# Patient Record
Sex: Male | Born: 1970 | Race: White | Hispanic: No | Marital: Married | Smoking: Never smoker
Health system: Southern US, Community
[De-identification: ages and names within clinical notes are randomized; demographics above are authoritative.]

## PROBLEM LIST (undated history)

## (undated) DIAGNOSIS — E781 Pure hyperglyceridemia: Secondary | ICD-10-CM

## (undated) DIAGNOSIS — Z8249 Family history of ischemic heart disease and other diseases of the circulatory system: Secondary | ICD-10-CM

## (undated) DIAGNOSIS — J40 Bronchitis, not specified as acute or chronic: Secondary | ICD-10-CM

## (undated) DIAGNOSIS — H6690 Otitis media, unspecified, unspecified ear: Secondary | ICD-10-CM

## (undated) DIAGNOSIS — J349 Unspecified disorder of nose and nasal sinuses: Secondary | ICD-10-CM

## (undated) DIAGNOSIS — R040 Epistaxis: Secondary | ICD-10-CM

## (undated) DIAGNOSIS — R490 Dysphonia: Secondary | ICD-10-CM

## (undated) DIAGNOSIS — R079 Chest pain, unspecified: Secondary | ICD-10-CM

## (undated) DIAGNOSIS — L509 Urticaria, unspecified: Secondary | ICD-10-CM

## (undated) DIAGNOSIS — M1990 Unspecified osteoarthritis, unspecified site: Secondary | ICD-10-CM

## (undated) DIAGNOSIS — Z460 Encounter for fitting and adjustment of spectacles and contact lenses: Secondary | ICD-10-CM

## (undated) HISTORY — DX: Epistaxis: R04.0

## (undated) HISTORY — PX: APPENDECTOMY: SHX54

## (undated) HISTORY — DX: Otitis media, unspecified, unspecified ear: H66.90

## (undated) HISTORY — DX: Bronchitis, not specified as acute or chronic: J40

## (undated) HISTORY — DX: Chest pain, unspecified: R07.9

## (undated) HISTORY — DX: Dysphonia: R49.0

## (undated) HISTORY — DX: Encounter for fitting and adjustment of spectacles and contact lenses: Z46.0

## (undated) HISTORY — DX: Urticaria, unspecified: L50.9

## (undated) HISTORY — DX: Unspecified osteoarthritis, unspecified site: M19.90

## (undated) HISTORY — DX: Pure hyperglyceridemia: E78.1

## (undated) HISTORY — DX: Family history of ischemic heart disease and other diseases of the circulatory system: Z82.49

## (undated) HISTORY — DX: Unspecified disorder of nose and nasal sinuses: J34.9

---

## 1999-12-30 ENCOUNTER — Emergency Department (HOSPITAL_COMMUNITY): Admission: EM | Admit: 1999-12-30 | Discharge: 1999-12-30 | Payer: Self-pay | Admitting: Emergency Medicine

## 2004-01-09 ENCOUNTER — Encounter: Admission: RE | Admit: 2004-01-09 | Discharge: 2004-04-08 | Payer: Self-pay | Admitting: *Deleted

## 2005-08-15 ENCOUNTER — Ambulatory Visit (HOSPITAL_COMMUNITY): Admission: RE | Admit: 2005-08-15 | Discharge: 2005-08-15 | Payer: Self-pay | Admitting: Orthopedic Surgery

## 2008-11-30 ENCOUNTER — Encounter (INDEPENDENT_AMBULATORY_CARE_PROVIDER_SITE_OTHER): Payer: Self-pay | Admitting: *Deleted

## 2009-08-25 ENCOUNTER — Observation Stay (HOSPITAL_COMMUNITY): Admission: EM | Admit: 2009-08-25 | Discharge: 2009-08-26 | Payer: Self-pay | Admitting: Emergency Medicine

## 2011-01-15 LAB — COMPREHENSIVE METABOLIC PANEL
ALT: 45 U/L (ref 0–53)
AST: 25 U/L (ref 0–37)
Albumin: 4.4 g/dL (ref 3.5–5.2)
Alkaline Phosphatase: 90 U/L (ref 39–117)
BUN: 14 mg/dL (ref 6–23)
CO2: 28 mEq/L (ref 19–32)
Calcium: 10.2 mg/dL (ref 8.4–10.5)
Chloride: 102 mEq/L (ref 96–112)
Creatinine, Ser: 1.02 mg/dL (ref 0.4–1.5)
GFR calc Af Amer: >60 mL/min (ref >60)
GFR calc non Af Amer: >60 mL/min (ref >60)
Glucose, Bld: 124 mg/dL — ABNORMAL HIGH (ref 70–99)
Potassium: 3.7 mEq/L (ref 3.5–5.1)
Sodium: 137 mEq/L (ref 135–145)
Total Bilirubin: 1 mg/dL (ref 0.3–1.2)
Total Protein: 7.4 g/dL (ref 6.0–8.3)

## 2011-01-15 LAB — CBC
HCT: 44.2 % (ref 39.0–52.0)
Hemoglobin: 15.6 g/dL (ref 13.0–17.0)
MCHC: 35.4 g/dL (ref 30.0–36.0)
MCV: 89.4 fL (ref 78.0–100.0)
Platelets: 236 10*3/uL (ref 150–400)
RBC: 4.94 MIL/uL (ref 4.22–5.81)
RDW: 12.4 % (ref 11.5–15.5)
WBC: 11.3 10*3/uL — ABNORMAL HIGH (ref 4.0–10.5)

## 2011-01-15 LAB — DIFFERENTIAL
Basophils Absolute: 0 10*3/uL (ref 0.0–0.1)
Basophils Relative: 0 % (ref 0–1)
Eosinophils Absolute: 0.1 10*3/uL (ref 0.0–0.7)
Eosinophils Relative: 1 % (ref 0–5)
Lymphocytes Relative: 18 % (ref 12–46)
Lymphs Abs: 2 10*3/uL (ref 0.7–4.0)
Monocytes Absolute: 0.6 10*3/uL (ref 0.1–1.0)
Monocytes Relative: 5 % (ref 3–12)
Neutro Abs: 8.5 10*3/uL — ABNORMAL HIGH (ref 1.7–7.7)
Neutrophils Relative %: 76 % (ref 43–77)

## 2011-01-15 LAB — URINALYSIS, ROUTINE W REFLEX MICROSCOPIC
Bilirubin Urine: NEGATIVE
Glucose, UA: NEGATIVE mg/dL
Hgb urine dipstick: NEGATIVE
Ketones, ur: NEGATIVE mg/dL
Nitrite: NEGATIVE
Protein, ur: NEGATIVE mg/dL
Specific Gravity, Urine: 1.022 (ref 1.005–1.030)
Urobilinogen, UA: 0.2 mg/dL (ref 0.0–1.0)
pH: 7.5 (ref 5.0–8.0)

## 2012-03-16 ENCOUNTER — Encounter (INDEPENDENT_AMBULATORY_CARE_PROVIDER_SITE_OTHER): Payer: Self-pay | Admitting: General Surgery

## 2013-02-16 ENCOUNTER — Ambulatory Visit (HOSPITAL_COMMUNITY)
Admission: RE | Admit: 2013-02-16 | Discharge: 2013-02-16 | Disposition: A | Discharge status: Home / Self Care | Payer: BC Managed Care – PPO | Source: Ambulatory Visit | Attending: Sports Medicine | Admitting: Sports Medicine

## 2013-02-16 ENCOUNTER — Other Ambulatory Visit (HOSPITAL_COMMUNITY): Payer: Self-pay | Admitting: Sports Medicine

## 2013-02-16 DIAGNOSIS — Z01818 Encounter for other preprocedural examination: Secondary | ICD-10-CM | POA: Insufficient documentation

## 2013-02-16 DIAGNOSIS — Z139 Encounter for screening, unspecified: Secondary | ICD-10-CM

## 2013-05-04 ENCOUNTER — Other Ambulatory Visit: Payer: Self-pay | Admitting: Dermatology

## 2013-08-09 ENCOUNTER — Encounter: Payer: Self-pay | Admitting: Cardiology

## 2013-08-11 ENCOUNTER — Encounter: Payer: Self-pay | Admitting: Cardiology

## 2013-08-11 ENCOUNTER — Ambulatory Visit (INDEPENDENT_AMBULATORY_CARE_PROVIDER_SITE_OTHER): Payer: BC Managed Care – PPO | Admitting: Cardiology

## 2013-08-11 VITALS — BP 118/76 | HR 68 | Ht 69.5 in | Wt 185.0 lb

## 2013-08-11 DIAGNOSIS — Z8249 Family history of ischemic heart disease and other diseases of the circulatory system: Secondary | ICD-10-CM | POA: Insufficient documentation

## 2013-08-11 DIAGNOSIS — E781 Pure hyperglyceridemia: Secondary | ICD-10-CM

## 2013-08-11 DIAGNOSIS — R079 Chest pain, unspecified: Secondary | ICD-10-CM

## 2013-08-11 HISTORY — DX: Family history of ischemic heart disease and other diseases of the circulatory system: Z82.49

## 2013-08-11 HISTORY — DX: Pure hyperglyceridemia: E78.1

## 2013-08-11 HISTORY — DX: Chest pain, unspecified: R07.9

## 2013-08-11 NOTE — Progress Notes (Signed)
[address], [address] [place], [place]  [address] Phone: [phone] Fax:  [phone]  Date:  08/11/2013   ID:  Ricky Bautista, DOB 1971-09-29, MRN [id number]  PCP:  Gweneth Dimitri, MD   History of Present Illness: Ricky Bautista is a 42 y.o. male here for the evaluation of chest pain. Has a strong family history of early CAD, father, paternal siblings with MIs/bypasses in their 24s, has a history himself of hypertriglyceridemia with low HDL, high small LDL particle number. He's had 2 separate episodes of awakening in the middle of the night with substernal/midepigastric pain described as a tightness radiating around his chest wall. Back to front, bandlike. Intense pain. Ear throbbed, sweaty.  He does not think he was necessarily heartburn. Most recent episode occurred in late September of 2014. He exercises and is physically active on his job as well as with his son's [employer] trip. He is not complaining of any shortness of breath or chest pain with exercise. EKG was performed and personally viewed on 07/25/13 which showed sinus rhythm with no other abnormalities. Hemoglobin A1c 5.5, glucose 121 slightly elevated, creatinine 1.05, total cholesterol 203, triglycerides 342, LDL 101 this has increased. Recommended considering a trial of Crestor since he has not tolerated Niaspan (flusing) or Lopid treatment (liver) long-term.  Currently doing well exercise-induced chest pain.  Wt Readings from Last 3 Encounters:  08/11/13 185 lb (83.915 kg)     Past Medical History  Diagnosis Date  . Bronchitis   . Ear infection   . Sinus problem   . Bleeding nose   . Hoarseness   . Contact lens/glasses fitting   . Arthritis     Past Surgical History  Procedure Laterality Date  . Appendectomy      No current outpatient prescriptions on file.   No current facility-administered medications for this visit.    Allergies:    Allergies  Allergen Reactions  . Erythromycin   .  Toradol [Ketorolac Tromethamine]     Social History:  The patient  reports that he has never smoked. He has never used smokeless tobacco. He reports that he does not drink alcohol or use illicit drugs.   Family History  Problem Relation Age of Onset  . Heart attack Mother     ROS:  Please see the history of present illness.   Denies any fevers, chills, orthopnea, PND, syncope, bleeding, fevers, dysphagia.   All other systems reviewed and negative.   PHYSICAL EXAM: VS:  BP 118/76  Pulse 68  Ht 5' 9.5" (1.765 m)  Wt 185 lb (83.915 kg)  BMI 26.94 kg/m2 Well nourished, well developed, in no acute distress HEENT: normal, [place]/AT, EOMI Neck: no JVD, normal carotid upstroke, no bruit Cardiac:  normal S1, S2; RRR; no murmur Lungs:  clear to auscultation bilaterally, no wheezing, rhonchi or rales Abd: soft, nontender, no hepatomegaly, no bruits Ext: no edema, 2+ distal pulses Skin: warm and dry GU: deferred Neuro: no focal abnormalities noted, AAO x 3  EKG:  No ST segment changes, normal. See lab work above, prior medical records reviewed.  ASSESSMENT AND PLAN:  1. Chest pain-strong family history, hyperlipidemia/hypertriglyceridemia. Recommendation is for exercise treadmill test. I also recommend taking Crestor as was previously recommended by Dr. Corliss Blacker. I think given his strong family history, history of hypertriglyceridemia this would be of benefit for him from a primary prevention standpoint. He seemed hesitant about this medication, concerned about potential liver issues or  long-term issues. I do feel that the benefit outweighs the risk. Calcium scoring, $150 out-of-pocket expense, could be considered as well if it would help him or push him in the direction of statin therapy if he feels as though there is evidence of coronary atherosclerosis taking place. Continue with fitness, exercise. He knows to contact me if any worrisome symptoms develop. Also consider low-dose  aspirin. 2. Familial hypertriglyceridemia-triglycerides fluctuated from 1200, 350 currently. 350 currently is without medication. Did encourage him to take at least fish oil which can help reduce triglycerides up to 50%. He had liver issues in the past with Lopid he states. Flushing with Niaspan although he seemed to tolerate this. Overall, I think that Crestor would be a great addition for him. I asked him to strongly consider this. We discussed the risk of pancreatitis with triglycerides greater than 500. 3. Family history of CAD-early. Father. 4. We will followup with treadmill test.  Signed, Donato Schultz, MD [facility]  08/11/2013 11:24 AM

## 2013-08-11 NOTE — Patient Instructions (Signed)
Your physician has requested that you have an exercise tolerance test. For further information please visit [email or web address] Please also follow instruction sheet.  Your physician recommends that you continue on your current medications as directed. Please refer to the Current Medication list given to you today.  Your physician recommends that you follow-up as needed

## 2013-09-13 ENCOUNTER — Encounter: Payer: Self-pay | Admitting: Nurse Practitioner

## 2013-09-13 ENCOUNTER — Ambulatory Visit (INDEPENDENT_AMBULATORY_CARE_PROVIDER_SITE_OTHER): Payer: BC Managed Care – PPO | Admitting: Nurse Practitioner

## 2013-09-13 ENCOUNTER — Encounter (INDEPENDENT_AMBULATORY_CARE_PROVIDER_SITE_OTHER): Payer: Self-pay

## 2013-09-13 ENCOUNTER — Other Ambulatory Visit: Payer: Self-pay | Admitting: *Deleted

## 2013-09-13 VITALS — BP 133/89 | HR 88

## 2013-09-13 DIAGNOSIS — R079 Chest pain, unspecified: Secondary | ICD-10-CM

## 2013-09-13 DIAGNOSIS — E785 Hyperlipidemia, unspecified: Secondary | ICD-10-CM

## 2013-09-13 DIAGNOSIS — R9439 Abnormal result of other cardiovascular function study: Secondary | ICD-10-CM

## 2013-09-13 MED ORDER — ASPIRIN EC 81 MG PO TBEC: 81.0000 mg | DELAYED_RELEASE_TABLET | Freq: Every day | ORAL | Status: DC

## 2013-09-13 MED ORDER — ROSUVASTATIN CALCIUM 10 MG PO TABS: 10.0000 mg | ORAL_TABLET | Freq: Every day | ORAL | Status: DC

## 2013-09-13 NOTE — Progress Notes (Signed)
Exercise Treadmill Test  Pre-Exercise Testing Evaluation Rhythm: normal sinus  Rate: 78 BPM     Test  Exercise Tolerance Test Ordering MD: Donato Schultz, MD  Interpreting MD: Norma Fredrickson, NP  Unique Test No: 1  Treadmill:  1  Indication for ETT: chest pain - rule out ischemia/family hx  Contraindication to ETT: Yes   Stress Modality: exercise - treadmill  Cardiac Imaging Performed: non   Protocol: standard Bruce - maximal  Max BP:  181/86  Max MPHR (bpm):  178 85% MPR (bpm):  151  MPHR obtained (bpm):  166 % MPHR obtained:  93%  Reached 85% MPHR (min:sec):  9:50 Total Exercise Time (min-sec):  12 minutes  Workload in METS:  13.4 Borg Scale: 13  Reason ETT Terminated:  desired heart rate attained    ST Segment Analysis At Rest: normal ST segments - no evidence of significant ST depression With Exercise: significant ischemic ST depression  Other Information Arrhythmia:  No Angina during ETT:  absent (0) Quality of ETT:  diagnostic  ETT Interpretation:  abnormal - evidence of ST depression consistent with ischemia  Comments: Patient presents today for routine GXT. Has positive FH for CAD and dyslipidemia. No active symptoms at present but has had 2 past episodes of chest discomfort at night. He exercises regularly. Not on treatment for his HLD.  Today the patient exercised on the standard Bruce protocol for a total of 12 minutes.  Excellent exercise tolerance.  Adequate blood pressure response.  Clinically negative for chest pain. Test was stopped due to achievement of target HR and EKG changes.  EKG positive for ischemia at the peak of exercise. No significant arrhythmia noted.   Recommendations: His GXT is abnormal. His tracings have been reviewed with Dr. Anne Fu. Patient was without symptoms during this study. Cardiac calcium not felt to be needed at this time. Have discussed with Dr. Anne Fu - will proceed with Myoview testing. Low threshold to proceed with cardiac  catheterization.  Start baby aspirin daily  Start Crestor 10 mg daily - recheck labs in 4 weeks.  Further disposition to follow.   Patient is agreeable to this plan and will call if any problems develop in the interim.   Rosalio Macadamia, RN, ANP-C [facility] [address] [place], [place]  [address]

## 2013-09-13 NOTE — Patient Instructions (Signed)
Start baby aspirin daily  Start Crestor 10 mg daily  We will arrange for a stress Myoview  Recheck labs in 4 weeks   Call the [facility] office at [phone] if you have any questions, problems or concerns.

## 2013-09-15 ENCOUNTER — Ambulatory Visit (HOSPITAL_COMMUNITY): Payer: BC Managed Care – PPO | Attending: Cardiology | Admitting: Radiology

## 2013-09-15 VITALS — BP 116/91 | HR 64 | Ht 69.5 in | Wt 184.0 lb

## 2013-09-15 DIAGNOSIS — R9439 Abnormal result of other cardiovascular function study: Secondary | ICD-10-CM

## 2013-09-15 DIAGNOSIS — Z8249 Family history of ischemic heart disease and other diseases of the circulatory system: Secondary | ICD-10-CM | POA: Insufficient documentation

## 2013-09-15 DIAGNOSIS — R079 Chest pain, unspecified: Secondary | ICD-10-CM | POA: Insufficient documentation

## 2013-09-15 DIAGNOSIS — E785 Hyperlipidemia, unspecified: Secondary | ICD-10-CM | POA: Insufficient documentation

## 2013-09-15 MED ORDER — TECHNETIUM TC 99M SESTAMIBI GENERIC - CARDIOLITE
11.0000 | Freq: Once | INTRAVENOUS | Status: AC | PRN
  Administered 2013-09-15: 11 via INTRAVENOUS

## 2013-09-15 MED ORDER — TECHNETIUM TC 99M SESTAMIBI GENERIC - CARDIOLITE
33.0000 | Freq: Once | INTRAVENOUS | Status: AC | PRN
  Administered 2013-09-15: 33 via INTRAVENOUS

## 2013-09-15 NOTE — Progress Notes (Signed)
[facility] [address] NUCLEAR MED [address] [place], [place] [address] [phone]    Cardiology Nuclear Med Study  Ricky Bautista is a 42 y.o. male     MRN : [id number]     DOB: [birth year]  Procedure Date: 09/15/2013  Nuclear Med Background Indication for Stress Test:  Evaluation for Ischemia and Abnormal GXT History:  No known CAD Cardiac Risk Factors: Family History - CAD and Lipids  Symptoms:  Chest Pain   Nuclear Pre-Procedure Caffeine/Decaff Intake:  None NPO After: 9:00pm   Lungs:  clear O2 Sat: 99% on room air. IV 0.9% NS with Angio Cath:  22g  IV Site: R Hand  IV Started by:  Bonnita Levan, RN  Chest Size (in):  42 Cup Size: n/a  Height: 5' 9.5" (1.765 m)  Weight:  184 lb (83.462 kg)  BMI:  Body mass index is 26.79 kg/(m^2). Tech Comments:  N/A    Nuclear Med Study 1 or 2 day study: 1 day  Stress Test Type:  Stress  Reading MD: Donato Schultz, MD  Order Authorizing Provider:  Donato Schultz, MD  Resting Radionuclide: Technetium 64m Sestamibi  Resting Radionuclide Dose: 11.0 mCi   Stress Radionuclide:  Technetium 43m Sestamibi  Stress Radionuclide Dose: 33.0 mCi           Stress Protocol Rest HR: 64 Stress HR: 166  Rest BP: 116/91 Stress BP: 152/82  Exercise Time (min): 11:00 METS: 13.3           Dose of Adenosine (mg):  n/a Dose of Lexiscan: n/a mg  Dose of Atropine (mg): n/a Dose of Dobutamine: n/a mcg/kg/min (at max HR)  Stress Test Technologist: Nelson Chimes, BS-ES  Nuclear Technologist:  Domenic Polite, CNMT     Rest Procedure:  Myocardial perfusion imaging was performed at rest 45 minutes following the intravenous administration of Technetium 72m Sestamibi. Rest ECG: NSR - Normal EKG  Stress Procedure:  The patient exercised on the treadmill utilizing the Bruce Protocol for 11:00 minutes. The patient stopped due to fatigue and denied any chest pain.  Technetium 55m Sestamibi was injected at peak exercise and myocardial perfusion imaging was  performed after a brief delay. Stress ECG: No significant change from baseline ECG  QPS Raw Data Images:  Mild diaphragmatic attenuation.  Normal left ventricular size. Stress Images:  There is decreased uptake in the inferior wall. Rest Images:  There is decreased uptake in the inferior wall. Subtraction (SDS):  No evidence of ischemia. Transient Ischemic Dilatation (Normal <1.22):  1.09 Lung/Heart Ratio (Normal <0.45):  0.42  Quantitative Gated Spect Images QGS EDV:  96 ml QGS ESV:  40 ml  Impression Exercise Capacity:  Excellent exercise capacity. BP Response:  Normal blood pressure response. Clinical Symptoms:  No significant symptoms noted. ECG Impression:  No significant ST segment change suggestive of ischemia. Comparison with Prior Nuclear Study: No previous nuclear study performed  Overall Impression:  Low risk stress nuclear study with no ischemia. Inferior wall defect likely diaphragmatic attenuation. If symptoms occur, further testing may be warranted. .  LV Ejection Fraction: 59%.  LV Wall Motion:  NL LV Function; NL Wall Motion

## 2013-09-19 ENCOUNTER — Telehealth: Payer: Self-pay | Admitting: Cardiology

## 2013-09-19 NOTE — Telephone Encounter (Signed)
New message     Pt want nuclear test results.--OK to tell wife because he will be in a mtg all day.

## 2013-09-22 NOTE — Telephone Encounter (Signed)
Spoke with patient previously and advised of results

## 2013-10-04 ENCOUNTER — Other Ambulatory Visit: Payer: BC Managed Care – PPO

## 2013-12-01 ENCOUNTER — Ambulatory Visit
Admission: RE | Admit: 2013-12-01 | Discharge: 2013-12-01 | Disposition: A | Discharge status: Home / Self Care | Payer: BC Managed Care – PPO | Source: Ambulatory Visit | Attending: Family Medicine | Admitting: Family Medicine

## 2013-12-01 ENCOUNTER — Other Ambulatory Visit: Payer: Self-pay | Admitting: Family Medicine

## 2013-12-01 DIAGNOSIS — W19XXXA Unspecified fall, initial encounter: Secondary | ICD-10-CM

## 2013-12-19 ENCOUNTER — Other Ambulatory Visit: Payer: Self-pay | Admitting: Dermatology

## 2014-01-27 ENCOUNTER — Encounter: Payer: Self-pay | Admitting: Family Medicine

## 2014-05-09 ENCOUNTER — Telehealth: Payer: Self-pay | Admitting: Cardiology

## 2014-05-09 DIAGNOSIS — Z8249 Family history of ischemic heart disease and other diseases of the circulatory system: Secondary | ICD-10-CM

## 2014-05-09 DIAGNOSIS — R079 Chest pain, unspecified: Secondary | ICD-10-CM

## 2014-05-09 NOTE — Telephone Encounter (Signed)
Pt calling because he wants to have a Coronary CA score completed.  States he and Dr Anne FuSkains have discussed this in the past but it was never ordered. States he is not having any new s/s just concerned because of his strong family history and a friend that was his age who died with an MI this weekend.  Advised I will discuss with Dr Anne FuSkains to obtain order and once that has been done he will be called to schedule.  He states understanding and is aware the testing is $150 out of pocket.

## 2014-05-09 NOTE — Telephone Encounter (Signed)
New problem    Pt's need to speak to nurse concerning a test that Dr Anne FuSkains had talked to pt about having. Pt's wife didn't remember the name of the scan. Please call pt.

## 2014-05-10 NOTE — Telephone Encounter (Signed)
Please proceed with Coronary calcium score at the office. Thanks. Dx. V17.3 (family history of CAD). Donato SchultzSKAINS, Bartholomew Ramesh, MD

## 2014-05-12 ENCOUNTER — Ambulatory Visit (INDEPENDENT_AMBULATORY_CARE_PROVIDER_SITE_OTHER)
Admission: RE | Admit: 2014-05-12 | Discharge: 2014-05-12 | Disposition: A | Discharge status: Home / Self Care | Payer: Self-pay | Source: Ambulatory Visit | Attending: Cardiology | Admitting: Cardiology

## 2014-05-12 DIAGNOSIS — Z8249 Family history of ischemic heart disease and other diseases of the circulatory system: Secondary | ICD-10-CM

## 2014-05-12 DIAGNOSIS — R079 Chest pain, unspecified: Secondary | ICD-10-CM

## 2014-05-15 ENCOUNTER — Telehealth: Payer: Self-pay | Admitting: Cardiology

## 2014-05-15 NOTE — Telephone Encounter (Signed)
New message     Want ct ca score results

## 2014-05-15 NOTE — Telephone Encounter (Signed)
PT  AWARE OF CA SCORE  RESULTS ./CY 

## 2014-09-26 ENCOUNTER — Telehealth: Payer: Self-pay | Admitting: *Deleted

## 2014-09-26 NOTE — Telephone Encounter (Signed)
-----   Message from Donato SchultzMark Skains, MD sent at 09/26/2014  8:20 AM EST ----- No need for follow-up ----- Message -----    From: Rocco SerenePamela Ezel Vallone, RN    Sent: 09/22/2014  10:31 AM      To: Donato SchultzMark Skains, MD  Does this pt need to f/u with you - 0 Ca score No recall in EPIC  Thanks,  Pam

## 2016-04-12 DIAGNOSIS — L03115 Cellulitis of right lower limb: Secondary | ICD-10-CM | POA: Diagnosis not present

## 2016-08-05 DIAGNOSIS — M7542 Impingement syndrome of left shoulder: Secondary | ICD-10-CM | POA: Diagnosis not present

## 2016-08-05 DIAGNOSIS — M25512 Pain in left shoulder: Secondary | ICD-10-CM | POA: Diagnosis not present

## 2016-10-23 DIAGNOSIS — L570 Actinic keratosis: Secondary | ICD-10-CM | POA: Diagnosis not present

## 2016-10-23 DIAGNOSIS — B078 Other viral warts: Secondary | ICD-10-CM | POA: Diagnosis not present

## 2017-03-24 DIAGNOSIS — Z131 Encounter for screening for diabetes mellitus: Secondary | ICD-10-CM | POA: Diagnosis not present

## 2017-03-24 DIAGNOSIS — E785 Hyperlipidemia, unspecified: Secondary | ICD-10-CM | POA: Diagnosis not present

## 2017-04-13 DIAGNOSIS — M25562 Pain in left knee: Secondary | ICD-10-CM | POA: Diagnosis not present

## 2017-04-13 DIAGNOSIS — M25512 Pain in left shoulder: Secondary | ICD-10-CM | POA: Diagnosis not present

## 2017-04-13 DIAGNOSIS — G8929 Other chronic pain: Secondary | ICD-10-CM | POA: Diagnosis not present

## 2017-04-14 DIAGNOSIS — E785 Hyperlipidemia, unspecified: Secondary | ICD-10-CM | POA: Diagnosis not present

## 2017-04-14 DIAGNOSIS — R7301 Impaired fasting glucose: Secondary | ICD-10-CM | POA: Diagnosis not present

## 2017-04-14 DIAGNOSIS — Z0000 Encounter for general adult medical examination without abnormal findings: Secondary | ICD-10-CM | POA: Diagnosis not present

## 2017-07-18 DIAGNOSIS — S81019A Laceration without foreign body, unspecified knee, initial encounter: Secondary | ICD-10-CM | POA: Diagnosis not present

## 2017-07-18 DIAGNOSIS — S81012A Laceration without foreign body, left knee, initial encounter: Secondary | ICD-10-CM | POA: Diagnosis not present

## 2017-07-18 DIAGNOSIS — W298XXA Contact with other powered powered hand tools and household machinery, initial encounter: Secondary | ICD-10-CM | POA: Diagnosis not present

## 2017-07-18 DIAGNOSIS — W228XXA Striking against or struck by other objects, initial encounter: Secondary | ICD-10-CM | POA: Diagnosis not present

## 2017-09-30 DIAGNOSIS — K1370 Unspecified lesions of oral mucosa: Secondary | ICD-10-CM | POA: Diagnosis not present

## 2017-10-01 DIAGNOSIS — D105 Benign neoplasm of other parts of oropharynx: Secondary | ICD-10-CM | POA: Diagnosis not present

## 2017-10-01 DIAGNOSIS — H93293 Other abnormal auditory perceptions, bilateral: Secondary | ICD-10-CM | POA: Diagnosis not present

## 2017-10-03 DIAGNOSIS — M25512 Pain in left shoulder: Secondary | ICD-10-CM | POA: Diagnosis not present

## 2017-10-03 DIAGNOSIS — G8929 Other chronic pain: Secondary | ICD-10-CM | POA: Diagnosis not present

## 2017-11-20 DIAGNOSIS — L820 Inflamed seborrheic keratosis: Secondary | ICD-10-CM | POA: Diagnosis not present

## 2017-11-20 DIAGNOSIS — D2272 Melanocytic nevi of left lower limb, including hip: Secondary | ICD-10-CM | POA: Diagnosis not present

## 2017-11-20 DIAGNOSIS — B078 Other viral warts: Secondary | ICD-10-CM | POA: Diagnosis not present

## 2017-11-20 DIAGNOSIS — Z85828 Personal history of other malignant neoplasm of skin: Secondary | ICD-10-CM | POA: Diagnosis not present

## 2017-11-20 DIAGNOSIS — L570 Actinic keratosis: Secondary | ICD-10-CM | POA: Diagnosis not present

## 2017-11-20 DIAGNOSIS — L738 Other specified follicular disorders: Secondary | ICD-10-CM | POA: Diagnosis not present

## 2017-11-24 DIAGNOSIS — L0889 Other specified local infections of the skin and subcutaneous tissue: Secondary | ICD-10-CM | POA: Diagnosis not present

## 2017-11-24 DIAGNOSIS — S60522A Blister (nonthermal) of left hand, initial encounter: Secondary | ICD-10-CM | POA: Diagnosis not present

## 2018-03-26 DIAGNOSIS — E785 Hyperlipidemia, unspecified: Secondary | ICD-10-CM | POA: Diagnosis not present

## 2018-03-26 DIAGNOSIS — S30860A Insect bite (nonvenomous) of lower back and pelvis, initial encounter: Secondary | ICD-10-CM | POA: Diagnosis not present

## 2018-03-26 DIAGNOSIS — R7301 Impaired fasting glucose: Secondary | ICD-10-CM | POA: Diagnosis not present

## 2018-04-22 DIAGNOSIS — Z0000 Encounter for general adult medical examination without abnormal findings: Secondary | ICD-10-CM | POA: Diagnosis not present

## 2018-05-20 ENCOUNTER — Ambulatory Visit: Payer: Self-pay | Admitting: Allergy

## 2018-05-28 ENCOUNTER — Ambulatory Visit: Payer: BLUE CROSS/BLUE SHIELD | Admitting: Allergy

## 2018-05-28 ENCOUNTER — Encounter: Payer: Self-pay | Admitting: Allergy

## 2018-05-28 VITALS — BP 122/70 | HR 63 | Temp 97.6°F | Ht 69.2 in | Wt 185.2 lb

## 2018-05-28 DIAGNOSIS — J301 Allergic rhinitis due to pollen: Secondary | ICD-10-CM

## 2018-05-28 DIAGNOSIS — T781XXA Other adverse food reactions, not elsewhere classified, initial encounter: Secondary | ICD-10-CM | POA: Diagnosis not present

## 2018-05-28 DIAGNOSIS — Z91018 Allergy to other foods: Secondary | ICD-10-CM

## 2018-05-28 NOTE — Patient Instructions (Addendum)
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   - will plan to repeat levels in a year to determine trend   - subsequent tick/chigger bites can drive persistence of positive IgE levels   - recommend avoidance of red meat products   - have access to self-injectable epinephrine (Epipen or AuviQ) 0.3mg  at all times   - follow emergency action plan in case of allergic reaction  Allergic rhinitis   - environmental allergy skin testing today is positive to grasses, trees, weeds, dog, cat and molds   - allergen avoidance measures provided   - continue zyrtec 1-2 times a day during spring season   - for itchy/watery/red eyes can use OTC Alaway or Zaditor 1 drop each eye as needed up to twice a day   - for nasal congestion recommend use of nasal steroid    - allergen immunotherapy discussed today including protocol, benefits and risk.  Informational handout provided.  If interested in this therapuetic option you can check with your insurance carrier for coverage.  Let us know if you would like to proceed with this option.    Adverse food reaction     - skin testing to apple is negative    - would recommend avoidance of specific apple and cherry products that has caused symptoms following ingestion    - you have been able to eat other apple and cherries from other stores without issue and with negative testing not concerned you are allergic to the fruit itself.      - recommend washing fresh fruits prior to eating  Follow-up 1 year or sooner if needed

## 2018-05-28 NOTE — Progress Notes (Addendum)
New Patient Note  RE: Ricky Bautista MRN: [id number] DOB: 21-Apr-1971 Date of Office Visit: 05/28/2018  Referring provider: Juluis RainierBarnes, Elizabeth, MD Primary care provider: Gweneth DimitriMcNeill, Wendy, MD  Chief Complaint: positive alpha-gal  History of present illness: Ricky Danceaul L Antos is a 47 y.o. male presenting today for evaluation of a positive alpha-gal lab test. He reports that he went to his PCP to be tested for lyme disease because he found an embedded [place] tick about two months ago. His PCP also tested him for alpha-gal and he was positive at 1.07 ku/L. He reports that he has been avoiding steak for months now because for the past year he gets abdominal pain, bloating, and diarrhea when he eats steak. He is able to eat hamburgers without issue. He eats other red meats including pork and venison without issue. He has an epi-pen. 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 Within the past few weeks he also had a reaction to apples and cherries from [employer] with symptoms including sneezing, congestion, rhinitis, itchy throat, and itchy ears. He reports that he has eaten apples and cherries numerous times in the past without any issue.  He states he was concerned that the fruit may have a gelatin-spray coating as he was talking to a friend of his who stated they have researched that there could be gelatin coatings on fruits.    Allergic rhinitis: he has a history of springtime allergic rhinitis with symptoms including nasal congestion, watery/itchy eyes, and rhinorrhea. He had allergy testing as a child and was on allergy shots for an unknown period of time back then. During the spring he takes zyrtec twice a day. He has not been on an antihistamine recently.  He has no history of asthma or eczema.   Review of systems: Review of Systems  Constitutional: Negative for chills, fever and malaise/fatigue.  HENT: Negative for  congestion, ear discharge, nosebleeds and sore throat.   Eyes: Negative for pain, discharge and redness.  Respiratory: Negative for cough, shortness of breath and wheezing.   Cardiovascular: Negative for chest pain.  Gastrointestinal: Negative for abdominal pain, constipation, diarrhea, heartburn, nausea and vomiting.  Musculoskeletal: Negative for joint pain.  Skin: Negative for itching and rash.  Neurological: Negative for headaches.    All other systems negative unless noted above in HPI  Past medical history: Past Medical History:  Diagnosis Date  . Arthritis   . Bleeding nose   . Bronchitis   . Chest pain 08/11/2013  . Contact lens/glasses fitting   . Ear infection   . 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 Explained risk of pancreatitis  . Family history of ischemic heart disease 08/11/2013  . Hoarseness   . Sinus problem   . Urticaria     Past surgical history: Past Surgical History:  Procedure Laterality Date  . APPENDECTOMY      Family history:  Family History  Problem Relation Age of Onset  . Heart attack Mother   . Allergic rhinitis Father   . Asthma Paternal Grandfather   . Eczema Paternal Grandfather   . Urticaria Paternal Grandfather     Social history: He lives in a home with carpeting with gas and electric heating and central cooling.  There are dogs and chickens outside the home.  There is no concern for water damage, mildew or roaches in the home.  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  He denies a smoking history.  Medication List: Allergies as of 05/28/2018      Reactions   Erythromycin    Toradol [ketorolac Tromethamine]       Medication List        Accurate as of 05/28/18 11:45 AM. Always use your most recent med list.          aspirin EC 81 MG tablet Take 1 tablet (81 mg total) by mouth daily.   Fish Oil 600 MG Caps Take by mouth.   rosuvastatin 10 MG  tablet Commonly known as:  CRESTOR Take 1 tablet (10 mg total) by mouth daily.   TURMERIC PO Take by mouth.       Known medication allergies: Allergies  Allergen Reactions  . Erythromycin   . Toradol [Ketorolac Tromethamine]      Physical examination: Blood pressure 122/70, pulse 63, temperature 97.6 F (36.4 C), temperature source Oral, height 5' 9.2" (1.758 m), weight 185 lb 3.2 oz (84 kg), SpO2 98 %.  General: Alert, interactive, in no acute distress. HEENT: TMs pearly gray, turbinates minimally edematous without discharge, post-pharynx unremarkable. Neck: Supple without lymphadenopathy. Lungs: Clear to auscultation without wheezing, rhonchi or rales. {no increased work of breathing. CV: Normal S1, S2 without murmurs. Abdomen: Nondistended, nontender. Skin: Warm and dry, without lesions or rashes. Extremities:  No clubbing, cyanosis or edema. Neuro:   Grossly intact.  Diagnositics/Labs:  Allergy testing: environmental allergy skin prick testing is positive to grasses, trees, weeds, dog, cat.   Intradermal testing is positive to mold mix 2, mold mix 3, mold mix 4 Apple skin prick testing is negative Allergy testing results were read and interpreted by provider, documented by clinical staff.   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   - will plan to repeat levels in a year to determine trend   - subsequent tick/chigger bites can drive persistence of positive IgE levels   - recommend avoidance of red meat products   - have access to self-injectable epinephrine (Epipen or AuviQ) 0.3mg  at all times   - follow emergency action plan in case of allergic reaction  Allergic rhinitis   - environmental allergy skin testing today is positive to grasses, trees, weeds, dog, cat and molds   - allergen avoidance measures provided   - continue zyrtec 1-2 times a day during spring season   - for itchy/watery/red eyes can use OTC Alaway or  Zaditor 1 drop each eye as needed up to twice a day   - for nasal congestion recommend use of nasal steroid    - allergen immunotherapy discussed today including protocol, benefits and risk.  Informational handout provided.  If interested in this therapuetic option you can check with your insurance carrier for coverage.  Let us know if you would like to proceed with this option.    Adverse food reaction     - skin testing to apple is negative    - would recommend avoidance of specific apple and cherry products that has caused symptoms following ingestion    - you have been able to eat other apple and cherries from other stores without issue and with negative testing not concerned you are allergic to the fruit itself.      - recommend washing fresh fruits prior to eating  Follow-up 1 year or sooner if needed  Gavin PoundDeborah  Zykeem Bauserman, MD PGY-1  I performed a history and physical examination of the patient and discussed management with the resident. I reviewed the resident's note and agree with the documented findings and plan of care. The note in its entirety was edited by myself, including the physical exam, assessment, and plan.   I appreciate the opportunity to take part in Faraaz's care. Please do not hesitate to contact me with questions.  Sincerely,   Margo Aye, MD Allergy/Immunology [facility]

## 2018-06-10 ENCOUNTER — Telehealth: Payer: Self-pay | Admitting: Allergy

## 2018-06-10 MED ORDER — EPINEPHRINE 0.3 MG/0.3ML IJ SOAJ: 0.3000 mg | Freq: Once | INTRAMUSCULAR | 1 refills | Status: AC

## 2018-06-10 NOTE — Telephone Encounter (Signed)
Called number [phone] it was disconnected.

## 2018-06-10 NOTE — Telephone Encounter (Signed)
Prescription was sent originally but system discontinued. I have resent the prescription with no discontinuation date. Patient has not been notified.

## 2018-06-10 NOTE — Telephone Encounter (Signed)
Patient was seen 05-28-18 and was told he would be called for AuviQ. He never received a call, so he called them and they said they did not receive any prescription for this.

## 2018-07-09 DIAGNOSIS — M25512 Pain in left shoulder: Secondary | ICD-10-CM | POA: Diagnosis not present

## 2018-07-09 DIAGNOSIS — M25562 Pain in left knee: Secondary | ICD-10-CM | POA: Diagnosis not present

## 2018-08-25 DIAGNOSIS — M25512 Pain in left shoulder: Secondary | ICD-10-CM | POA: Diagnosis not present

## 2018-08-26 DIAGNOSIS — E785 Hyperlipidemia, unspecified: Secondary | ICD-10-CM | POA: Diagnosis not present

## 2018-09-16 ENCOUNTER — Encounter (INDEPENDENT_AMBULATORY_CARE_PROVIDER_SITE_OTHER): Payer: Self-pay

## 2018-09-16 ENCOUNTER — Ambulatory Visit: Payer: BLUE CROSS/BLUE SHIELD | Admitting: Cardiology

## 2018-09-16 ENCOUNTER — Ambulatory Visit
Admission: RE | Admit: 2018-09-16 | Discharge: 2018-09-16 | Disposition: A | Discharge status: Home / Self Care | Payer: BLUE CROSS/BLUE SHIELD | Source: Ambulatory Visit | Attending: Cardiology | Admitting: Cardiology

## 2018-09-16 ENCOUNTER — Encounter: Payer: Self-pay | Admitting: Cardiology

## 2018-09-16 VITALS — BP 124/70 | HR 58 | Ht 69.2 in | Wt 188.0 lb

## 2018-09-16 DIAGNOSIS — Z8249 Family history of ischemic heart disease and other diseases of the circulatory system: Secondary | ICD-10-CM

## 2018-09-16 DIAGNOSIS — Z01810 Encounter for preprocedural cardiovascular examination: Secondary | ICD-10-CM | POA: Diagnosis not present

## 2018-09-16 NOTE — Progress Notes (Signed)
Cardiology Office Note:    Date:  09/16/2018   ID:  Ricky Bautista, DOB October 31, 1970, MRN [id number]  PCP:  Gweneth Dimitri, MD  Cardiologist:  Donato Schultz, MD  Electrophysiologist:  None   Referring MD: Gweneth Dimitri, MD     History of Present Illness:    Ricky Bautista is a 47 y.o. male here for cardiac risk evaluation consult with history of hyperlipidemia at the request of Dr. Selena Batten.  Has a family history of early coronary artery disease.  Nuclear stress test 09/16/2013 showed inferior wall defect likely diaphragmatic attenuation.  The study was felt to be overall low risk with no evidence of obvious ischemia.  Obviously if any worrisome symptoms were to develop further testing could be warranted.  In review of office note from 08/11/2013 he had had chest pain at the time with his father with MIs bypasses in their 4s.  High small LDL particle number he had 2 separate episodes of waking up in the middle night with chest discomfort.  Thought it was GERD.  Exercises.  Triglycerides 342 LDL 101.  Not tolerate Niaspan or Lopid treatment long-term because of liver.  Back on 05/12/2014 he did have a CT cardiac calcium score which was 1.5.  EKG from 07/25/2013 shows sinus rhythm without any other significant abnormalities personally reviewed and interpreted.  Most current lab work on 03/26/2018 shows total cholesterol 211 triglycerides 317 LDL 116 HDL 32.  Subsequent 08/26/2018-total cholesterol 218 HDL 36 triglycerides 260 LDL 130.  He is having a shoulder surgery.  Dr. Rennis Chris.  Past Medical History:  Diagnosis Date  . Arthritis   . Bleeding nose   . Bronchitis   . Chest pain 08/11/2013  . Contact lens/glasses fitting   . Ear infection   . 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 Explained risk of pancreatitis  . Family history of ischemic heart disease 08/11/2013  . Hoarseness   . Sinus problem   . Urticaria     Past Surgical History:  Procedure  Laterality Date  . APPENDECTOMY      Current Medications: Current Meds  Medication Sig  . Omega-3 Fatty Acids (FISH OIL) 600 MG CAPS Take by mouth.     Allergies:   Erythromycin and Toradol [ketorolac tromethamine]   Social History   Socioeconomic History  . Marital status: Single    Spouse name: Not on file  . Number of children: Not on file  . Years of education: Not on file  . Highest education level: Not on file  Occupational History  . Not on file  Social Needs  . Financial resource strain: Not on file  . Food insecurity:    Worry: Not on file    Inability: Not on file  . Transportation needs:    Medical: Not on file    Non-medical: Not on file  Tobacco Use  . Smoking status: Never Smoker  . Smokeless tobacco: Never Used  Substance and Sexual Activity  . Alcohol use: No  . Drug use: No  . Sexual activity: Not on file  Lifestyle  . Physical activity:    Days per week: Not on file    Minutes per session: Not on file  . Stress: Not on file  Relationships  . Social connections:    Talks on phone: Not on file    Gets together: Not on file    Attends religious service: Not on file    Active member of club or organization:  Not on file    Attends meetings of clubs or organizations: Not on file    Relationship status: Not on file  Other Topics Concern  . Not on file  Social History Narrative  . Not on file     Family History: The patient's family history includes Allergic rhinitis in his father; Asthma in his paternal grandfather; Eczema in his paternal grandfather; Heart attack in his mother; Urticaria in his paternal grandfather.  ROS:   Please see the history of present illness.    Denies any fevers chills nausea vomiting syncope bleeding all other systems reviewed and are negative.  EKGs/Labs/Other Studies Reviewed:    The following studies were reviewed today: Prior EKG stress test calcium score lab work  EKG:  EKG is  ordered today.  The ekg  ordered today demonstrates sinus bradycardia 58 with no other significant abnormalities.  Recent Labs: No results found for requested labs within last 8760 hours.  Recent Lipid Panel No results found for: CHOL, TRIG, HDL, CHOLHDL, VLDL, LDLCALC, LDLDIRECT  Physical Exam:    VS:  BP 124/70   Pulse (!) 58   Ht 5' 9.2" (1.758 m)   Wt 188 lb (85.3 kg)   BMI 27.60 kg/m     Wt Readings from Last 3 Encounters:  09/16/18 188 lb (85.3 kg)  05/28/18 185 lb 3.2 oz (84 kg)  09/15/13 184 lb (83.5 kg)     GEN:  Well nourished, well developed in no acute distress HEENT: Normal NECK: No JVD; No carotid bruits LYMPHATICS: No lymphadenopathy CARDIAC: RRR, no murmurs, rubs, gallops RESPIRATORY:  Clear to auscultation without rales, wheezing or rhonchi  ABDOMEN: Soft, non-tender, non-distended MUSCULOSKELETAL:  No edema; No deformity  SKIN: Warm and dry NEUROLOGIC:  Alert and oriented x 3 PSYCHIATRIC:  Normal affect   ASSESSMENT:    1. Family history of ischemic heart disease   2. Pre-operative cardiovascular examination    PLAN:    In order of problems listed above:  Preoperative cardiac risk notification for shoulder surgery -Left shoulder surgery- he may proceed with low overall cardiac risk.  He is able to complete greater than 4 METS of activity without any difficulty.  No anginal symptoms no valvular abnormalities.  Coronary risk -With a strong family history, we will proceed with a coronary calcium score.  If atherosclerosis is seen, I will encourage use of statin therapy.  He is quite reluctant to using this type of therapy.  Continue with dietary modifications lifestyle modifications.  If he would like, I will offer him visit in the lipid clinic to discuss further pharmacologic strategies for him.    Medication Adjustments/Labs and Tests Ordered: Current medicines are reviewed at length with the patient today.  Concerns regarding medicines are outlined above.  Orders Placed  This Encounter  Procedures  . CT CARDIAC SCORING  . EKG 12-Lead   No orders of the defined types were placed in this encounter.   Patient Instructions  Your physician recommends that you continue on your current medications as directed. Please refer to the Current Medication list given to you today.   CALCIUM SCORE  Your physician recommends that you schedule a follow-up appointment in: AS NEEDED    Signed, Donato SchultzMark Skyelar Swigart, MD  09/16/2018 9:18 AM    [facility]

## 2018-09-16 NOTE — Patient Instructions (Addendum)
Your physician recommends that you continue on your current medications as directed. Please refer to the Current Medication list given to you today.   CALCIUM SCORE  Your physician recommends that you schedule a follow-up appointment in: AS NEEDED

## 2018-09-22 ENCOUNTER — Telehealth: Payer: Self-pay | Admitting: Cardiology

## 2018-09-22 DIAGNOSIS — R931 Abnormal findings on diagnostic imaging of heart and coronary circulation: Secondary | ICD-10-CM

## 2018-09-22 NOTE — Telephone Encounter (Signed)
Called patient with CT results per Dr. Anne FuSkains, see below. Patient has an appointment with lipid clinic in January. Patient verbalized understanding.   Notes recorded by Jake BatheSkains, Mark C, MD on 09/21/2018 at 5:17 PM EST Calcium score is 10 with mild calcium noted in the proximal LAD artery. This is 94th percentile for age control. Because of this, I would recommend statin therapy for him. Please have him sit down with our lipid clinic to discuss pharmacologic therapy before starting.

## 2018-09-22 NOTE — Telephone Encounter (Signed)
New Message   Pt states he received his ct scoring results on my chart and he is wanting a nurse to contact him to interpret them. Please call

## 2018-09-24 DIAGNOSIS — G8918 Other acute postprocedural pain: Secondary | ICD-10-CM | POA: Diagnosis not present

## 2018-09-24 DIAGNOSIS — M25512 Pain in left shoulder: Secondary | ICD-10-CM | POA: Diagnosis not present

## 2018-09-24 DIAGNOSIS — M19012 Primary osteoarthritis, left shoulder: Secondary | ICD-10-CM | POA: Diagnosis not present

## 2018-09-24 DIAGNOSIS — M7542 Impingement syndrome of left shoulder: Secondary | ICD-10-CM | POA: Diagnosis not present

## 2018-09-24 DIAGNOSIS — Z4889 Encounter for other specified surgical aftercare: Secondary | ICD-10-CM | POA: Diagnosis not present

## 2018-09-24 DIAGNOSIS — R600 Localized edema: Secondary | ICD-10-CM | POA: Diagnosis not present

## 2018-09-25 DIAGNOSIS — M7542 Impingement syndrome of left shoulder: Secondary | ICD-10-CM | POA: Diagnosis not present

## 2018-09-25 DIAGNOSIS — Z4889 Encounter for other specified surgical aftercare: Secondary | ICD-10-CM | POA: Diagnosis not present

## 2018-09-25 DIAGNOSIS — R600 Localized edema: Secondary | ICD-10-CM | POA: Diagnosis not present

## 2018-09-26 DIAGNOSIS — Z4889 Encounter for other specified surgical aftercare: Secondary | ICD-10-CM | POA: Diagnosis not present

## 2018-09-26 DIAGNOSIS — R600 Localized edema: Secondary | ICD-10-CM | POA: Diagnosis not present

## 2018-09-26 DIAGNOSIS — M7542 Impingement syndrome of left shoulder: Secondary | ICD-10-CM | POA: Diagnosis not present

## 2018-09-27 DIAGNOSIS — R600 Localized edema: Secondary | ICD-10-CM | POA: Diagnosis not present

## 2018-09-27 DIAGNOSIS — Z4889 Encounter for other specified surgical aftercare: Secondary | ICD-10-CM | POA: Diagnosis not present

## 2018-09-27 DIAGNOSIS — M7542 Impingement syndrome of left shoulder: Secondary | ICD-10-CM | POA: Diagnosis not present

## 2018-09-28 DIAGNOSIS — R600 Localized edema: Secondary | ICD-10-CM | POA: Diagnosis not present

## 2018-09-28 DIAGNOSIS — M7542 Impingement syndrome of left shoulder: Secondary | ICD-10-CM | POA: Diagnosis not present

## 2018-09-28 DIAGNOSIS — Z4889 Encounter for other specified surgical aftercare: Secondary | ICD-10-CM | POA: Diagnosis not present

## 2018-09-29 DIAGNOSIS — Z4889 Encounter for other specified surgical aftercare: Secondary | ICD-10-CM | POA: Diagnosis not present

## 2018-09-29 DIAGNOSIS — R600 Localized edema: Secondary | ICD-10-CM | POA: Diagnosis not present

## 2018-09-29 DIAGNOSIS — M7542 Impingement syndrome of left shoulder: Secondary | ICD-10-CM | POA: Diagnosis not present

## 2018-09-30 DIAGNOSIS — R600 Localized edema: Secondary | ICD-10-CM | POA: Diagnosis not present

## 2018-09-30 DIAGNOSIS — Z4889 Encounter for other specified surgical aftercare: Secondary | ICD-10-CM | POA: Diagnosis not present

## 2018-09-30 DIAGNOSIS — M7542 Impingement syndrome of left shoulder: Secondary | ICD-10-CM | POA: Diagnosis not present

## 2018-10-01 DIAGNOSIS — R600 Localized edema: Secondary | ICD-10-CM | POA: Diagnosis not present

## 2018-10-01 DIAGNOSIS — Z4889 Encounter for other specified surgical aftercare: Secondary | ICD-10-CM | POA: Diagnosis not present

## 2018-10-01 DIAGNOSIS — M7542 Impingement syndrome of left shoulder: Secondary | ICD-10-CM | POA: Diagnosis not present

## 2018-10-02 DIAGNOSIS — R600 Localized edema: Secondary | ICD-10-CM | POA: Diagnosis not present

## 2018-10-02 DIAGNOSIS — Z4889 Encounter for other specified surgical aftercare: Secondary | ICD-10-CM | POA: Diagnosis not present

## 2018-10-02 DIAGNOSIS — M7542 Impingement syndrome of left shoulder: Secondary | ICD-10-CM | POA: Diagnosis not present

## 2018-10-02 DIAGNOSIS — M25572 Pain in left ankle and joints of left foot: Secondary | ICD-10-CM | POA: Diagnosis not present

## 2018-10-03 DIAGNOSIS — Z4889 Encounter for other specified surgical aftercare: Secondary | ICD-10-CM | POA: Diagnosis not present

## 2018-10-03 DIAGNOSIS — M7542 Impingement syndrome of left shoulder: Secondary | ICD-10-CM | POA: Diagnosis not present

## 2018-10-03 DIAGNOSIS — R600 Localized edema: Secondary | ICD-10-CM | POA: Diagnosis not present

## 2018-10-04 DIAGNOSIS — M7542 Impingement syndrome of left shoulder: Secondary | ICD-10-CM | POA: Diagnosis not present

## 2018-10-04 DIAGNOSIS — Z4889 Encounter for other specified surgical aftercare: Secondary | ICD-10-CM | POA: Diagnosis not present

## 2018-10-04 DIAGNOSIS — R600 Localized edema: Secondary | ICD-10-CM | POA: Diagnosis not present

## 2018-10-04 DIAGNOSIS — M25512 Pain in left shoulder: Secondary | ICD-10-CM | POA: Diagnosis not present

## 2018-10-05 DIAGNOSIS — M7542 Impingement syndrome of left shoulder: Secondary | ICD-10-CM | POA: Diagnosis not present

## 2018-10-05 DIAGNOSIS — R600 Localized edema: Secondary | ICD-10-CM | POA: Diagnosis not present

## 2018-10-05 DIAGNOSIS — Z4889 Encounter for other specified surgical aftercare: Secondary | ICD-10-CM | POA: Diagnosis not present

## 2018-10-06 DIAGNOSIS — M7542 Impingement syndrome of left shoulder: Secondary | ICD-10-CM | POA: Diagnosis not present

## 2018-10-06 DIAGNOSIS — R600 Localized edema: Secondary | ICD-10-CM | POA: Diagnosis not present

## 2018-10-06 DIAGNOSIS — Z4889 Encounter for other specified surgical aftercare: Secondary | ICD-10-CM | POA: Diagnosis not present

## 2018-10-07 DIAGNOSIS — M7542 Impingement syndrome of left shoulder: Secondary | ICD-10-CM | POA: Diagnosis not present

## 2018-10-07 DIAGNOSIS — Z4889 Encounter for other specified surgical aftercare: Secondary | ICD-10-CM | POA: Diagnosis not present

## 2018-10-07 DIAGNOSIS — R600 Localized edema: Secondary | ICD-10-CM | POA: Diagnosis not present

## 2018-10-08 DIAGNOSIS — M25512 Pain in left shoulder: Secondary | ICD-10-CM | POA: Diagnosis not present

## 2018-10-08 DIAGNOSIS — R600 Localized edema: Secondary | ICD-10-CM | POA: Diagnosis not present

## 2018-10-08 DIAGNOSIS — Z4889 Encounter for other specified surgical aftercare: Secondary | ICD-10-CM | POA: Diagnosis not present

## 2018-10-08 DIAGNOSIS — M7542 Impingement syndrome of left shoulder: Secondary | ICD-10-CM | POA: Diagnosis not present

## 2018-10-09 DIAGNOSIS — Z4889 Encounter for other specified surgical aftercare: Secondary | ICD-10-CM | POA: Diagnosis not present

## 2018-10-09 DIAGNOSIS — R600 Localized edema: Secondary | ICD-10-CM | POA: Diagnosis not present

## 2018-10-09 DIAGNOSIS — M7542 Impingement syndrome of left shoulder: Secondary | ICD-10-CM | POA: Diagnosis not present

## 2018-10-10 DIAGNOSIS — M7542 Impingement syndrome of left shoulder: Secondary | ICD-10-CM | POA: Diagnosis not present

## 2018-10-10 DIAGNOSIS — Z4889 Encounter for other specified surgical aftercare: Secondary | ICD-10-CM | POA: Diagnosis not present

## 2018-10-10 DIAGNOSIS — R600 Localized edema: Secondary | ICD-10-CM | POA: Diagnosis not present

## 2018-10-11 DIAGNOSIS — M7542 Impingement syndrome of left shoulder: Secondary | ICD-10-CM | POA: Diagnosis not present

## 2018-10-11 DIAGNOSIS — Z4889 Encounter for other specified surgical aftercare: Secondary | ICD-10-CM | POA: Diagnosis not present

## 2018-10-11 DIAGNOSIS — R600 Localized edema: Secondary | ICD-10-CM | POA: Diagnosis not present

## 2018-10-12 DIAGNOSIS — M7542 Impingement syndrome of left shoulder: Secondary | ICD-10-CM | POA: Diagnosis not present

## 2018-10-12 DIAGNOSIS — M25512 Pain in left shoulder: Secondary | ICD-10-CM | POA: Diagnosis not present

## 2018-10-12 DIAGNOSIS — R600 Localized edema: Secondary | ICD-10-CM | POA: Diagnosis not present

## 2018-10-12 DIAGNOSIS — Z4889 Encounter for other specified surgical aftercare: Secondary | ICD-10-CM | POA: Diagnosis not present

## 2018-10-13 DIAGNOSIS — R600 Localized edema: Secondary | ICD-10-CM | POA: Diagnosis not present

## 2018-10-13 DIAGNOSIS — M25512 Pain in left shoulder: Secondary | ICD-10-CM | POA: Diagnosis not present

## 2018-10-13 DIAGNOSIS — Z4889 Encounter for other specified surgical aftercare: Secondary | ICD-10-CM | POA: Diagnosis not present

## 2018-10-13 DIAGNOSIS — M7542 Impingement syndrome of left shoulder: Secondary | ICD-10-CM | POA: Diagnosis not present

## 2018-10-14 ENCOUNTER — Ambulatory Visit: Payer: BLUE CROSS/BLUE SHIELD

## 2018-10-14 DIAGNOSIS — R600 Localized edema: Secondary | ICD-10-CM | POA: Diagnosis not present

## 2018-10-14 DIAGNOSIS — M7542 Impingement syndrome of left shoulder: Secondary | ICD-10-CM | POA: Diagnosis not present

## 2018-10-14 DIAGNOSIS — Z4889 Encounter for other specified surgical aftercare: Secondary | ICD-10-CM | POA: Diagnosis not present

## 2018-10-15 DIAGNOSIS — M7542 Impingement syndrome of left shoulder: Secondary | ICD-10-CM | POA: Diagnosis not present

## 2018-10-15 DIAGNOSIS — M25512 Pain in left shoulder: Secondary | ICD-10-CM | POA: Diagnosis not present

## 2018-10-15 DIAGNOSIS — Z4889 Encounter for other specified surgical aftercare: Secondary | ICD-10-CM | POA: Diagnosis not present

## 2018-10-15 DIAGNOSIS — R600 Localized edema: Secondary | ICD-10-CM | POA: Diagnosis not present

## 2018-10-16 DIAGNOSIS — R600 Localized edema: Secondary | ICD-10-CM | POA: Diagnosis not present

## 2018-10-16 DIAGNOSIS — M7542 Impingement syndrome of left shoulder: Secondary | ICD-10-CM | POA: Diagnosis not present

## 2018-10-16 DIAGNOSIS — Z4889 Encounter for other specified surgical aftercare: Secondary | ICD-10-CM | POA: Diagnosis not present

## 2018-10-17 DIAGNOSIS — M7542 Impingement syndrome of left shoulder: Secondary | ICD-10-CM | POA: Diagnosis not present

## 2018-10-17 DIAGNOSIS — R600 Localized edema: Secondary | ICD-10-CM | POA: Diagnosis not present

## 2018-10-17 DIAGNOSIS — Z4889 Encounter for other specified surgical aftercare: Secondary | ICD-10-CM | POA: Diagnosis not present

## 2018-10-18 DIAGNOSIS — R600 Localized edema: Secondary | ICD-10-CM | POA: Diagnosis not present

## 2018-10-18 DIAGNOSIS — M25512 Pain in left shoulder: Secondary | ICD-10-CM | POA: Diagnosis not present

## 2018-10-18 DIAGNOSIS — Z4889 Encounter for other specified surgical aftercare: Secondary | ICD-10-CM | POA: Diagnosis not present

## 2018-10-18 DIAGNOSIS — M7542 Impingement syndrome of left shoulder: Secondary | ICD-10-CM | POA: Diagnosis not present

## 2018-10-19 DIAGNOSIS — R600 Localized edema: Secondary | ICD-10-CM | POA: Diagnosis not present

## 2018-10-19 DIAGNOSIS — M7542 Impingement syndrome of left shoulder: Secondary | ICD-10-CM | POA: Diagnosis not present

## 2018-10-19 DIAGNOSIS — Z4889 Encounter for other specified surgical aftercare: Secondary | ICD-10-CM | POA: Diagnosis not present

## 2018-10-20 DIAGNOSIS — Z4889 Encounter for other specified surgical aftercare: Secondary | ICD-10-CM | POA: Diagnosis not present

## 2018-10-20 DIAGNOSIS — M7542 Impingement syndrome of left shoulder: Secondary | ICD-10-CM | POA: Diagnosis not present

## 2018-10-20 DIAGNOSIS — R600 Localized edema: Secondary | ICD-10-CM | POA: Diagnosis not present

## 2018-10-21 DIAGNOSIS — M25512 Pain in left shoulder: Secondary | ICD-10-CM | POA: Diagnosis not present

## 2018-10-22 DIAGNOSIS — H43813 Vitreous degeneration, bilateral: Secondary | ICD-10-CM | POA: Diagnosis not present

## 2018-10-25 DIAGNOSIS — M25512 Pain in left shoulder: Secondary | ICD-10-CM | POA: Diagnosis not present

## 2018-10-28 DIAGNOSIS — M25512 Pain in left shoulder: Secondary | ICD-10-CM | POA: Diagnosis not present

## 2019-01-30 DIAGNOSIS — B029 Zoster without complications: Secondary | ICD-10-CM | POA: Diagnosis not present

## 2019-01-31 DIAGNOSIS — B029 Zoster without complications: Secondary | ICD-10-CM | POA: Diagnosis not present

## 2019-03-04 DIAGNOSIS — H5213 Myopia, bilateral: Secondary | ICD-10-CM | POA: Diagnosis not present

## 2019-03-22 DIAGNOSIS — M25512 Pain in left shoulder: Secondary | ICD-10-CM | POA: Diagnosis not present

## 2019-06-01 DIAGNOSIS — Z4789 Encounter for other orthopedic aftercare: Secondary | ICD-10-CM | POA: Diagnosis not present

## 2019-06-01 DIAGNOSIS — M25512 Pain in left shoulder: Secondary | ICD-10-CM | POA: Diagnosis not present

## 2019-06-07 DIAGNOSIS — Z23 Encounter for immunization: Secondary | ICD-10-CM | POA: Diagnosis not present

## 2019-06-07 DIAGNOSIS — E785 Hyperlipidemia, unspecified: Secondary | ICD-10-CM | POA: Diagnosis not present

## 2019-06-07 DIAGNOSIS — R5383 Other fatigue: Secondary | ICD-10-CM | POA: Diagnosis not present

## 2019-06-07 DIAGNOSIS — R7301 Impaired fasting glucose: Secondary | ICD-10-CM | POA: Diagnosis not present

## 2019-06-07 DIAGNOSIS — Z0000 Encounter for general adult medical examination without abnormal findings: Secondary | ICD-10-CM | POA: Diagnosis not present

## 2019-08-18 ENCOUNTER — Emergency Department (HOSPITAL_COMMUNITY)
Admission: EM | Admit: 2019-08-18 | Discharge: 2019-08-18 | Disposition: A | Discharge status: Home / Self Care | Payer: BC Managed Care – PPO | Attending: Emergency Medicine | Admitting: Emergency Medicine

## 2019-08-18 ENCOUNTER — Emergency Department (HOSPITAL_COMMUNITY): Payer: BC Managed Care – PPO

## 2019-08-18 ENCOUNTER — Other Ambulatory Visit: Payer: Self-pay

## 2019-08-18 DIAGNOSIS — K5792 Diverticulitis of intestine, part unspecified, without perforation or abscess without bleeding: Secondary | ICD-10-CM

## 2019-08-18 DIAGNOSIS — R1032 Left lower quadrant pain: Secondary | ICD-10-CM | POA: Diagnosis not present

## 2019-08-18 DIAGNOSIS — R109 Unspecified abdominal pain: Secondary | ICD-10-CM | POA: Diagnosis not present

## 2019-08-18 DIAGNOSIS — K760 Fatty (change of) liver, not elsewhere classified: Secondary | ICD-10-CM | POA: Diagnosis not present

## 2019-08-18 LAB — CBC
HCT: 42.1 % (ref 39.0–52.0)
Hemoglobin: 15 g/dL (ref 13.0–17.0)
MCH: 31.5 pg (ref 26.0–34.0)
MCHC: 35.6 g/dL (ref 30.0–36.0)
MCV: 88.4 fL (ref 80.0–100.0)
Platelets: 211 10*3/uL (ref 150–400)
RBC: 4.76 MIL/uL (ref 4.22–5.81)
RDW: 11.6 % (ref 11.5–15.5)
WBC: 10.1 10*3/uL (ref 4.0–10.5)
nRBC: 0 % (ref 0.0–0.2)

## 2019-08-18 LAB — COMPREHENSIVE METABOLIC PANEL
ALT: 50 U/L — ABNORMAL HIGH (ref 0–44)
AST: 21 U/L (ref 15–41)
Albumin: 4.5 g/dL (ref 3.5–5.0)
Alkaline Phosphatase: 105 U/L (ref 38–126)
Anion gap: 8 (ref 5–15)
BUN: 16 mg/dL (ref 6–20)
CO2: 26 mmol/L (ref 22–32)
Calcium: 9.7 mg/dL (ref 8.9–10.3)
Chloride: 102 mmol/L (ref 98–111)
Creatinine, Ser: 0.96 mg/dL (ref 0.61–1.24)
GFR calc Af Amer: >60 mL/min (ref >60)
GFR calc non Af Amer: >60 mL/min (ref >60)
Glucose, Bld: 128 mg/dL — ABNORMAL HIGH (ref 70–99)
Potassium: 4.1 mmol/L (ref 3.5–5.1)
Sodium: 136 mmol/L (ref 135–145)
Total Bilirubin: 1.7 mg/dL — ABNORMAL HIGH (ref 0.3–1.2)
Total Protein: 7.9 g/dL (ref 6.5–8.1)

## 2019-08-18 LAB — URINALYSIS, ROUTINE W REFLEX MICROSCOPIC
Bilirubin Urine: NEGATIVE
Glucose, UA: NEGATIVE mg/dL
Hgb urine dipstick: NEGATIVE
Ketones, ur: NEGATIVE mg/dL
Leukocytes,Ua: NEGATIVE
Nitrite: NEGATIVE
Protein, ur: NEGATIVE mg/dL
Specific Gravity, Urine: 1.002 — ABNORMAL LOW (ref 1.005–1.030)
pH: 6 (ref 5.0–8.0)

## 2019-08-18 LAB — LIPASE, BLOOD: Lipase: 38 U/L (ref 11–51)

## 2019-08-18 MED ORDER — ONDANSETRON 4 MG PO TBDP: 4.0000 mg | ORAL_TABLET | Freq: Three times a day (TID) | ORAL | 0 refills | Status: AC | PRN

## 2019-08-18 MED ORDER — CIPROFLOXACIN HCL 500 MG PO TABS: 500.0000 mg | ORAL_TABLET | Freq: Two times a day (BID) | ORAL | 0 refills | Status: AC

## 2019-08-18 MED ORDER — METRONIDAZOLE 500 MG PO TABS: 500.0000 mg | ORAL_TABLET | Freq: Two times a day (BID) | ORAL | 0 refills | Status: AC

## 2019-08-18 MED ORDER — IOHEXOL 300 MG/ML  SOLN
75.0000 mL | Freq: Once | INTRAMUSCULAR | Status: AC | PRN
  Administered 2019-08-18: 75 mL via INTRAVENOUS

## 2019-08-18 MED ORDER — SODIUM CHLORIDE 0.9% FLUSH: 3.0000 mL | Freq: Once | INTRAVENOUS | Status: DC

## 2019-08-18 NOTE — ED Provider Notes (Signed)
[facility]-EMERGENCY DEPT Provider Note   CSN: [id number] Arrival date & time: 08/18/19  1400     History   Chief Complaint Chief Complaint  Patient presents with  . Abdominal Pain    HPI Ricky Bautista is a 48 y.o. male presents to the ER for evaluation of abdominal pain.  Onset Tuesday night.  Localized to the left lower quadrant with slight radiation to the left mid abdomen and suprapubic and right-sided abdomen.  The pain is constant, progressively worsening.  It is worse with movement and palpation.  States driving over here in the car and sitting up in bed makes the pain worse.  He works in [employer] and yesterday felt like any movement made the pain a lot worse.  Tried Tylenol at noon without significant improvement.  Had some "achy joints" but this has resolved completely.  He [facility] this morning Dr. Uvaldo RisingMcNeil who told him he had a "acute belly" and told him to come to the ER for evaluation.  No modifying factors.  Denies associated fever, nausea, vomiting, changes to bowel movements, blood in stool, melena, dysuria, urinary urgency or hesitancy.  History of appendectomy.  No history of kidney stones.  No falls or trauma or exercise to cause muscular pain on that side. HPI  Past Medical History:  Diagnosis Date  . Arthritis   . Bleeding nose   . Bronchitis   . Chest pain 08/11/2013  . Contact lens/glasses fitting   . Ear infection   . 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 Explained risk of pancreatitis  . Family history of ischemic heart disease 08/11/2013  . Hoarseness   . Sinus problem   . Urticaria     Patient Active Problem List   Diagnosis Date Noted  . Chest pain 08/11/2013  . Family history of ischemic heart disease 08/11/2013  . 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 APPENDECTOMY          Home Medications     Prior to Admission medications   Medication Sig Start Date End Date Taking? Authorizing Provider  ciprofloxacin (CIPRO) 500 MG tablet Take 1 tablet (500 mg total) by mouth every 12 (twelve) hours. 08/18/19   Liberty HandyGibbons, Monifah Freehling J, PA-C  metroNIDAZOLE (FLAGYL) 500 MG tablet Take 1 tablet (500 mg total) by mouth 2 (two) times daily. 08/18/19   Liberty HandyGibbons, Chondra Boyde J, PA-C  Omega-3 Fatty Acids (FISH OIL) 600 MG CAPS Take by mouth.    [provider]  ondansetron (ZOFRAN ODT) 4 MG disintegrating tablet Take 1 tablet (4 mg total) by mouth every 8 (eight) hours as needed for nausea or vomiting. 08/18/19   Liberty HandyGibbons, Dreana Britz J, PA-C    Family History Family History  Problem Relation Age of Onset  . Heart attack Mother   . Allergic rhinitis Father   . Asthma Paternal Grandfather   . Eczema Paternal Grandfather   . Urticaria Paternal Grandfather     Social History Social History   Tobacco Use  . Smoking status: Never Smoker  . Smokeless tobacco: Never Used  Substance Use Topics  . Alcohol use: No  . Drug use: No     Allergies   Erythromycin and Toradol [ketorolac tromethamine]   Review of Systems Review of Systems  Gastrointestinal: Positive for abdominal pain.  All other systems reviewed and are negative.    Physical Exam Updated Vital Signs BP Marland Kitchen(!)  120/91   Pulse 86   Temp 97.9 F (36.6 C) (Oral)   Resp 18   SpO2 100%   Physical Exam Vitals signs and nursing note reviewed.  Constitutional:      Appearance: He is well-developed.     Comments: Non toxic.  HENT:     Head: Normocephalic and atraumatic.     Nose: Nose normal.  Eyes:     Conjunctiva/sclera: Conjunctivae normal.  Neck:     Musculoskeletal: Normal range of motion.  Cardiovascular:     Rate and Rhythm: Normal rate and regular rhythm.     Heart sounds: Normal heart sounds.  Pulmonary:     Effort: Pulmonary effort is normal.     Breath sounds: Normal breath sounds.  Abdominal:     General: Bowel sounds  are normal.     Palpations: Abdomen is soft.     Tenderness: There is abdominal tenderness in the suprapubic area, left upper quadrant and left lower quadrant. There is guarding.     Comments: Moderate tenderness and guarding at left mid abdomen, left lower quadrant, suprapubic area.  Mild left CVA tenderness.  Active BS to lower quadrants.  No distention, rigidity.  Negative Murphy's and McBurney's.  Musculoskeletal: Normal range of motion.  Skin:    General: Skin is warm and dry.     Capillary Refill: Capillary refill takes less than 2 seconds.  Neurological:     Mental Status: He is alert.  Psychiatric:        Behavior: Behavior normal.      ED Treatments / Results  Labs (all labs ordered are listed, but only abnormal results are displayed) Labs Reviewed  COMPREHENSIVE METABOLIC PANEL - Abnormal; Notable for the following components:      Result Value   Glucose, Bld 128 (*)    ALT 50 (*)    Total Bilirubin 1.7 (*)    All other components within normal limits  URINALYSIS, ROUTINE W REFLEX MICROSCOPIC - Abnormal; Notable for the following components:   Color, Urine COLORLESS (*)    Specific Gravity, Urine 1.002 (*)    All other components within normal limits  LIPASE, BLOOD  CBC    EKG None  Radiology Ct Abdomen Pelvis W Contrast  Result Date: 08/18/2019 CLINICAL DATA:  48 year old male with LEFT abdominal and pelvic pain for 2 days. EXAM: CT ABDOMEN AND PELVIS WITH CONTRAST TECHNIQUE: Multidetector CT imaging of the abdomen and pelvis was performed using the standard protocol following bolus administration of intravenous contrast. CONTRAST:  64mL OMNIPAQUE IOHEXOL 300 MG/ML  SOLN COMPARISON:  08/25/2009 CT FINDINGS: Lower chest: No acute abnormality. Hepatobiliary: Hepatic steatosis again noted without other hepatic abnormality. The gallbladder is unremarkable. No biliary dilatation. Pancreas: Unremarkable Spleen: Unremarkable Adrenals/Urinary Tract: Kidneys, adrenal glands  and bladder are unremarkable. Stomach/Bowel: Focal wall thickening and inflammation of the mid descending colon is noted compatible with acute diverticulitis. No evidence of bowel obstruction, pneumoperitoneum or abscess. No other bowel abnormalities are identified. Vascular/Lymphatic: No significant vascular findings are present. No enlarged abdominal or pelvic lymph nodes. Reproductive: Prostate is unremarkable. Other: No ascites. Musculoskeletal: No acute or significant osseous findings. IMPRESSION: 1. Focal wall thickening and inflammation of the mid descending colon compatible with acute diverticulitis. No evidence of bowel obstruction, pneumoperitoneum or abscess. Clinical follow-up recommended. 2. Hepatic steatosis. Electronically Signed   By: Harmon Pier M.D.   On: 08/18/2019 19:41    Procedures Procedures (including critical care time)  Medications Ordered in ED Medications  sodium chloride flush (NS) 0.9 % injection 3 mL (has no administration in time range)  iohexol (OMNIPAQUE) 300 MG/ML solution 75 mL (75 mLs Intravenous Contrast Given 08/18/19 1901)     Initial Impression / Assessment and Plan / ED Course  I have reviewed the triage vital signs and the nursing notes.  Pertinent labs & imaging results that were available during my care of the patient were reviewed by me and considered in my medical decision making (see chart for details).  DDX includes viral gastroenteritis, diverticulitis.  Although he has no associated symptoms such as fever, nausea, vomiting, diarrhea, hematochezia or blood in stool.  No urinary symptoms at all or history of kidney stones and UTI, pyelonephritis, renal stone is unlikely.  Your work-up reviewed by me is completely normal.  No leukocytosis.  Electrolytes normal.  Creatinine is normal.  UA without RBCs, signs of infection.  Given amount of tenderness on exam, referral by PCP, will obtain a CT.  Patient declined pain medicine.  2049: CT A/P with  interpreted by me as well as radiologist.  Noted uncomplicated left-sided diverticulitis.  Repeated abdominal exam/reevaluated patient and no clinical decline.  Given age, benign abd exam, CT and work up he is appropriate for OP tx of diverticulitis. Discussed findings and plan to DC with Cipro/Flagyl, Tylenol, close PCP reevaluation in 2 to 3 days.  Is comfortable with this plan.  Strict return precautions given, he is aware of symptoms that warrant return to the ER for reevaluation. Final Clinical Impressions(s) / ED Diagnoses   Final diagnoses:  Diverticulitis    ED Discharge Orders         Ordered    metroNIDAZOLE (FLAGYL) 500 MG tablet  2 times daily     08/18/19 2038    ciprofloxacin (CIPRO) 500 MG tablet  Every 12 hours     08/18/19 2038    ondansetron (ZOFRAN ODT) 4 MG disintegrating tablet  Every 8 hours PRN     08/18/19 2046           Arlean Hopping 08/18/19 2051    Daleen Bo, MD 08/19/19 1106

## 2019-08-18 NOTE — ED Notes (Signed)
Per PCP-patient has rebound tenderness left abdomin-states also complaining of a migraine-history of-Dr Blima Singer states he might have diverticulitis, perforation or abscess

## 2019-08-18 NOTE — ED Triage Notes (Signed)
Pt presents with c/o lower left quadrant abdominal pain since Tuesday of this week, no N/V/D.

## 2019-08-18 NOTE — Discharge Instructions (Addendum)
You were seen in the ER for left lower abdominal pain  CT shows uncomplicated diverticulitis  Take ciprofloxacin and metronidazole as prescribed. I have sent a prescription for ondansetron (zofran) for nausea, if you develop it  There is no evidence that dietary changes are necessary, some suggest mild diet and clear fluids until you are re-evaluated in 2-3 days  Call primary care doctor and make an appointment for re-evaluation in the next 48-72 hours to ensure symptoms are improving and no symptoms to suggest complications (abscess, perforation, blood infection)  Return to ER for worsening pain, fever greater than 100, vomiting

## 2019-09-06 DIAGNOSIS — M25512 Pain in left shoulder: Secondary | ICD-10-CM | POA: Diagnosis not present

## 2019-09-06 DIAGNOSIS — M25562 Pain in left knee: Secondary | ICD-10-CM | POA: Diagnosis not present

## 2019-09-14 DIAGNOSIS — M25512 Pain in left shoulder: Secondary | ICD-10-CM | POA: Diagnosis not present

## 2019-09-14 DIAGNOSIS — Z4789 Encounter for other orthopedic aftercare: Secondary | ICD-10-CM | POA: Diagnosis not present

## 2019-12-12 DIAGNOSIS — M25562 Pain in left knee: Secondary | ICD-10-CM | POA: Diagnosis not present

## 2019-12-12 DIAGNOSIS — M79671 Pain in right foot: Secondary | ICD-10-CM | POA: Diagnosis not present

## 2019-12-12 DIAGNOSIS — M170 Bilateral primary osteoarthritis of knee: Secondary | ICD-10-CM | POA: Diagnosis not present

## 2020-05-07 DIAGNOSIS — M25562 Pain in left knee: Secondary | ICD-10-CM | POA: Diagnosis not present

## 2020-05-07 DIAGNOSIS — M7652 Patellar tendinitis, left knee: Secondary | ICD-10-CM | POA: Diagnosis not present

## 2020-05-23 DIAGNOSIS — M25562 Pain in left knee: Secondary | ICD-10-CM | POA: Diagnosis not present

## 2020-06-01 DIAGNOSIS — M25562 Pain in left knee: Secondary | ICD-10-CM | POA: Diagnosis not present

## 2020-06-08 DIAGNOSIS — M25562 Pain in left knee: Secondary | ICD-10-CM | POA: Diagnosis not present

## 2020-10-09 DIAGNOSIS — R7301 Impaired fasting glucose: Secondary | ICD-10-CM | POA: Diagnosis not present

## 2020-10-09 DIAGNOSIS — E785 Hyperlipidemia, unspecified: Secondary | ICD-10-CM | POA: Diagnosis not present

## 2020-10-15 DIAGNOSIS — E785 Hyperlipidemia, unspecified: Secondary | ICD-10-CM | POA: Diagnosis not present

## 2020-10-15 DIAGNOSIS — Z0001 Encounter for general adult medical examination with abnormal findings: Secondary | ICD-10-CM | POA: Diagnosis not present

## 2020-10-15 DIAGNOSIS — I2510 Atherosclerotic heart disease of native coronary artery without angina pectoris: Secondary | ICD-10-CM | POA: Diagnosis not present

## 2020-10-15 DIAGNOSIS — E118 Type 2 diabetes mellitus with unspecified complications: Secondary | ICD-10-CM | POA: Diagnosis not present

## 2020-10-22 DIAGNOSIS — Z1211 Encounter for screening for malignant neoplasm of colon: Secondary | ICD-10-CM | POA: Diagnosis not present

## 2020-11-16 DIAGNOSIS — S83242D Other tear of medial meniscus, current injury, left knee, subsequent encounter: Secondary | ICD-10-CM | POA: Diagnosis not present

## 2020-11-21 DIAGNOSIS — M2022 Hallux rigidus, left foot: Secondary | ICD-10-CM | POA: Diagnosis not present

## 2021-01-16 DIAGNOSIS — E118 Type 2 diabetes mellitus with unspecified complications: Secondary | ICD-10-CM | POA: Diagnosis not present

## 2021-01-16 DIAGNOSIS — E785 Hyperlipidemia, unspecified: Secondary | ICD-10-CM | POA: Diagnosis not present

## 2021-03-20 ENCOUNTER — Encounter (INDEPENDENT_AMBULATORY_CARE_PROVIDER_SITE_OTHER): Payer: BC Managed Care – PPO | Admitting: Nurse Practitioner

## 2021-03-20 NOTE — Progress Notes (Deleted)
 @  Patient ID: Ricky Bautista, male    DOB: January 20, 1971, 50 y.o.   MRN: [id number]  No chief complaint on file.   Referring provider: Gweneth Dimitri, MD     HPI       Allergies  Allergen Reactions  . Erythromycin   . Toradol [Ketorolac Tromethamine]      There is no immunization history on file for this patient.  Past Medical History:  Diagnosis Date  . Arthritis   . Bleeding nose   . Bronchitis   . Chest pain 08/11/2013  . Contact lens/glasses fitting   . Ear infection   . 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 Explained risk of pancreatitis  . Family history of ischemic heart disease 08/11/2013  . Hoarseness   . Sinus problem   . Urticaria     Tobacco History: Social History   Tobacco Use  Smoking Status Never Smoker  Smokeless Tobacco Never Used   Counseling given: Not Answered   Outpatient Encounter Medications as of 03/20/2021  Medication Sig  . ciprofloxacin (CIPRO) 500 MG tablet Take 1 tablet (500 mg total) by mouth every 12 (twelve) hours.  . metroNIDAZOLE (FLAGYL) 500 MG tablet Take 1 tablet (500 mg total) by mouth 2 (two) times daily.  . Omega-3 Fatty Acids (FISH OIL) 600 MG CAPS Take by mouth.  . ondansetron (ZOFRAN ODT) 4 MG disintegrating tablet Take 1 tablet (4 mg total) by mouth every 8 (eight) hours as needed for nausea or vomiting.   No facility-administered encounter medications on file as of 03/20/2021.     Review of Systems  Review of Systems     Physical Exam  There were no vitals taken for this visit.  Wt Readings from Last 5 Encounters:  09/16/18 188 lb (85.3 kg)  05/28/18 185 lb 3.2 oz (84 kg)  09/15/13 184 lb (83.5 kg)  08/11/13 185 lb (83.9 kg)     Physical Exam   Lab Results:  CBC    Component Value Date/Time   WBC 10.1 08/18/2019 1540   RBC 4.76 08/18/2019 1540   HGB 15.0 08/18/2019 1540   HCT 42.1 08/18/2019 1540   PLT 211 08/18/2019 1540   MCV 88.4 08/18/2019 1540   MCH  31.5 08/18/2019 1540   MCHC 35.6 08/18/2019 1540   RDW 11.6 08/18/2019 1540   LYMPHSABS 2.0 08/24/2009 2115   MONOABS 0.6 08/24/2009 2115   EOSABS 0.1 08/24/2009 2115   BASOSABS 0.0 08/24/2009 2115    BMET    Component Value Date/Time   NA 136 08/18/2019 1540   K 4.1 08/18/2019 1540   CL 102 08/18/2019 1540   CO2 26 08/18/2019 1540   GLUCOSE 128 (H) 08/18/2019 1540   BUN 16 08/18/2019 1540   CREATININE 0.96 08/18/2019 1540   CALCIUM 9.7 08/18/2019 1540   GFRNONAA >60 08/18/2019 1540   GFRAA >60 08/18/2019 1540    BNP No results found for: BNP  ProBNP No results found for: PROBNP  Imaging: No results found.   Assessment & Plan:   No problem-specific Assessment & Plan notes found for this encounter.     Ivonne Andrew, NP 03/20/2021

## 2021-04-09 ENCOUNTER — Encounter: Payer: Self-pay | Admitting: *Deleted

## 2021-04-09 NOTE — Telephone Encounter (Signed)
This encounter was created in error - please disregard.

## 2021-07-17 ENCOUNTER — Encounter: Payer: Self-pay | Admitting: Gastroenterology

## 2021-07-19 ENCOUNTER — Other Ambulatory Visit: Payer: Self-pay | Admitting: Family Medicine

## 2021-07-19 DIAGNOSIS — E785 Hyperlipidemia, unspecified: Secondary | ICD-10-CM | POA: Diagnosis not present

## 2021-07-19 DIAGNOSIS — E118 Type 2 diabetes mellitus with unspecified complications: Secondary | ICD-10-CM | POA: Diagnosis not present

## 2021-07-19 DIAGNOSIS — R109 Unspecified abdominal pain: Secondary | ICD-10-CM | POA: Diagnosis not present

## 2021-08-13 ENCOUNTER — Ambulatory Visit
Admission: RE | Admit: 2021-08-13 | Discharge: 2021-08-13 | Disposition: A | Discharge status: Home / Self Care | Payer: BC Managed Care – PPO | Source: Ambulatory Visit | Attending: Family Medicine | Admitting: Family Medicine

## 2021-08-13 DIAGNOSIS — I10 Essential (primary) hypertension: Secondary | ICD-10-CM | POA: Diagnosis not present

## 2021-08-13 DIAGNOSIS — E785 Hyperlipidemia, unspecified: Secondary | ICD-10-CM | POA: Diagnosis not present

## 2021-08-13 DIAGNOSIS — Z136 Encounter for screening for cardiovascular disorders: Secondary | ICD-10-CM | POA: Diagnosis not present

## 2021-09-02 ENCOUNTER — Other Ambulatory Visit: Payer: Self-pay | Admitting: Family Medicine

## 2021-09-02 DIAGNOSIS — E7800 Pure hypercholesterolemia, unspecified: Secondary | ICD-10-CM | POA: Diagnosis not present

## 2021-09-02 DIAGNOSIS — R1032 Left lower quadrant pain: Secondary | ICD-10-CM | POA: Diagnosis not present

## 2021-09-02 DIAGNOSIS — I2510 Atherosclerotic heart disease of native coronary artery without angina pectoris: Secondary | ICD-10-CM | POA: Diagnosis not present

## 2021-09-13 ENCOUNTER — Ambulatory Visit
Admission: RE | Admit: 2021-09-13 | Discharge: 2021-09-13 | Disposition: A | Discharge status: Home / Self Care | Payer: BC Managed Care – PPO | Source: Ambulatory Visit | Attending: Family Medicine | Admitting: Family Medicine

## 2021-09-13 DIAGNOSIS — Z9049 Acquired absence of other specified parts of digestive tract: Secondary | ICD-10-CM | POA: Diagnosis not present

## 2021-09-13 DIAGNOSIS — K5730 Diverticulosis of large intestine without perforation or abscess without bleeding: Secondary | ICD-10-CM | POA: Diagnosis not present

## 2021-09-13 DIAGNOSIS — R1032 Left lower quadrant pain: Secondary | ICD-10-CM

## 2021-09-13 MED ORDER — IOPAMIDOL (ISOVUE-300) INJECTION 61%
100.0000 mL | Freq: Once | INTRAVENOUS | Status: AC | PRN
  Administered 2021-09-13: 100 mL via INTRAVENOUS

## 2021-09-26 DIAGNOSIS — Z1211 Encounter for screening for malignant neoplasm of colon: Secondary | ICD-10-CM | POA: Diagnosis not present

## 2021-09-26 DIAGNOSIS — K589 Irritable bowel syndrome without diarrhea: Secondary | ICD-10-CM | POA: Diagnosis not present

## 2021-10-16 DIAGNOSIS — E7800 Pure hypercholesterolemia, unspecified: Secondary | ICD-10-CM | POA: Diagnosis not present

## 2022-01-01 DIAGNOSIS — R109 Unspecified abdominal pain: Secondary | ICD-10-CM | POA: Diagnosis not present

## 2022-01-01 DIAGNOSIS — K580 Irritable bowel syndrome with diarrhea: Secondary | ICD-10-CM | POA: Diagnosis not present

## 2022-01-16 DIAGNOSIS — Z85828 Personal history of other malignant neoplasm of skin: Secondary | ICD-10-CM | POA: Diagnosis not present

## 2022-01-16 DIAGNOSIS — L570 Actinic keratosis: Secondary | ICD-10-CM | POA: Diagnosis not present

## 2022-01-16 DIAGNOSIS — L814 Other melanin hyperpigmentation: Secondary | ICD-10-CM | POA: Diagnosis not present

## 2022-01-16 DIAGNOSIS — L821 Other seborrheic keratosis: Secondary | ICD-10-CM | POA: Diagnosis not present

## 2022-01-16 DIAGNOSIS — L820 Inflamed seborrheic keratosis: Secondary | ICD-10-CM | POA: Diagnosis not present

## 2022-02-25 ENCOUNTER — Telehealth: Payer: Self-pay

## 2022-02-25 NOTE — Telephone Encounter (Signed)
Received message pt requested call back with start of next class.  ?Called pt. Next class will be 6/5 at 1030am at [facility]  ?He is agreeable to starting then M/W x 12 wks ?Intake scheduled for 5/30 at 10am-will meet him in the lobby of [facility]  ?

## 2022-03-04 IMAGING — CT CT CARDIAC CORONARY ARTERY CALCIUM SCORE
3 series · 13 of 20 positions shown, 15 images · non-contrast
Comparison: Prior calcium score study on 09/16/2018

CLINICAL DATA: 50-year-old Caucasian male with history
hyperlipidemia and family history of heart disease.

EXAM:
CT CARDIAC CORONARY ARTERY CALCIUM SCORE
TECHNIQUE: Non-contrast imaging through the heart was performed using
prospective ECG gating. Image post processing was performed on an
independent workstation, allowing for quantitative analysis of the
heart and coronary arteries. Note that this exam targets the heart
and the chest was not imaged in its entirety.

[Series 2: calcium scoring 2.00 qr36 bestdiast 70% hrt calciu · axial · 0.45mm/px · z∈[+1634,+1698]mm · 3 of 80 slices shown]
[im 16/80  vessel]
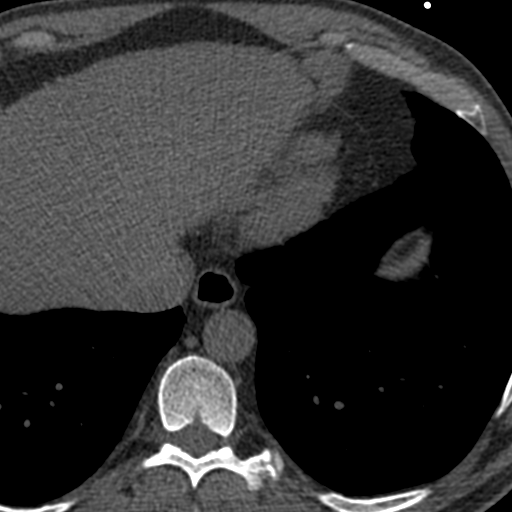
[im 32/80  vessel]
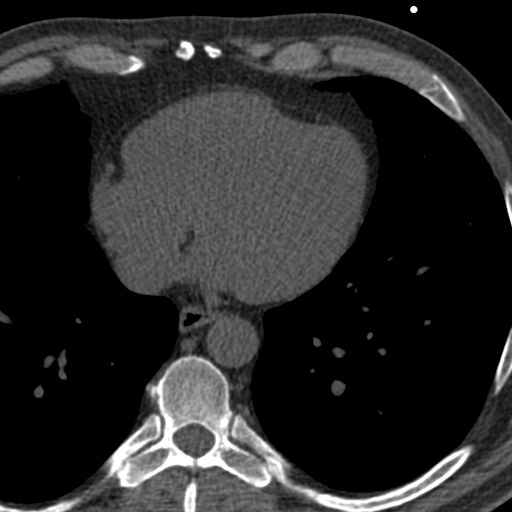
[im 48/80  vessel]
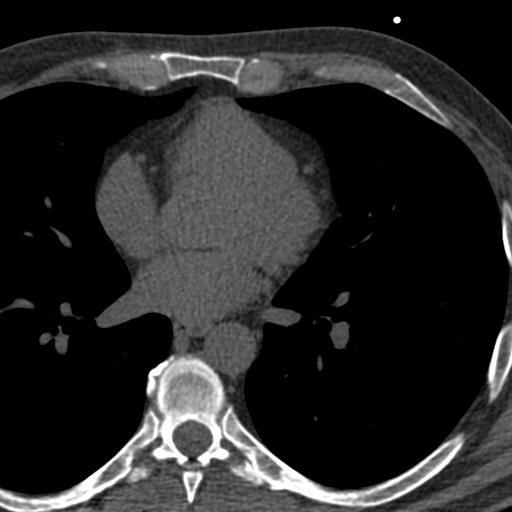

[Series 3: calcium scoring 2.00 br40 bestdiast 70% axial · axial · 0.56mm/px · z∈[+1630,+1734]mm · 5 of 80 slices shown, 7 images]
[im 14/80  vessel]
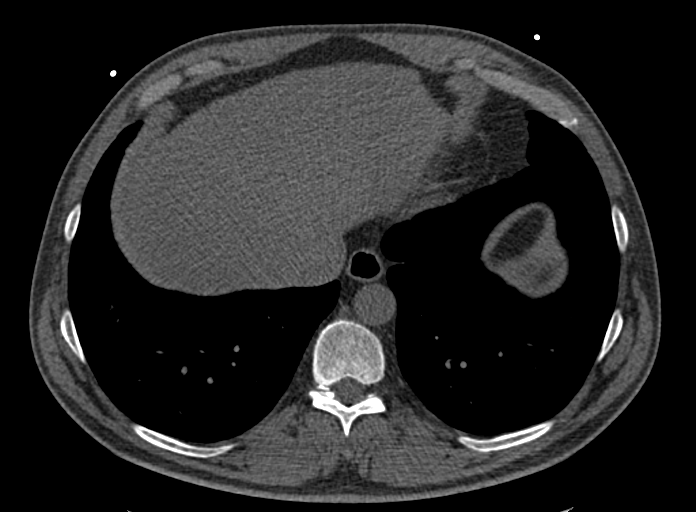
[im 14/80  lung]
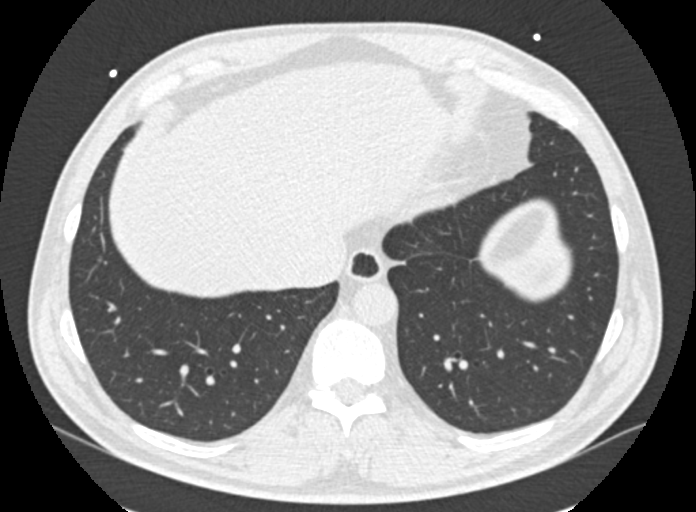
[im 27/80  vessel]
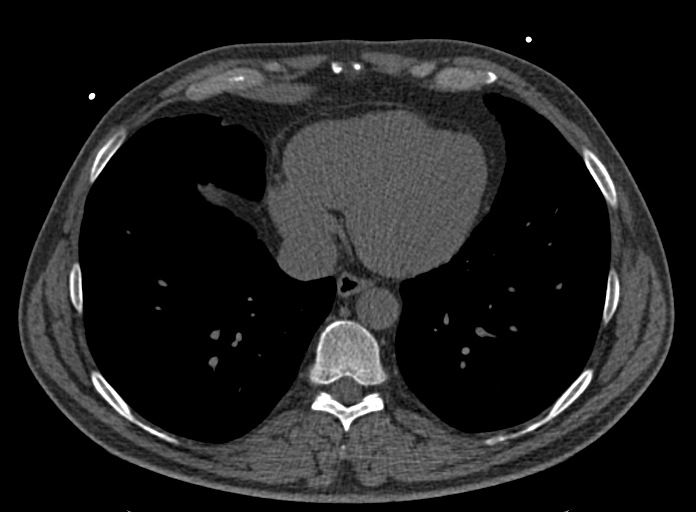
[im 40/80  vessel]
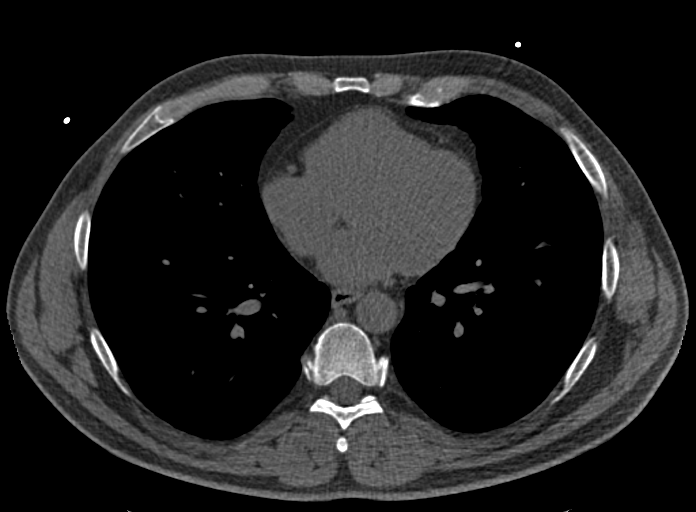
[im 53/80  vessel]
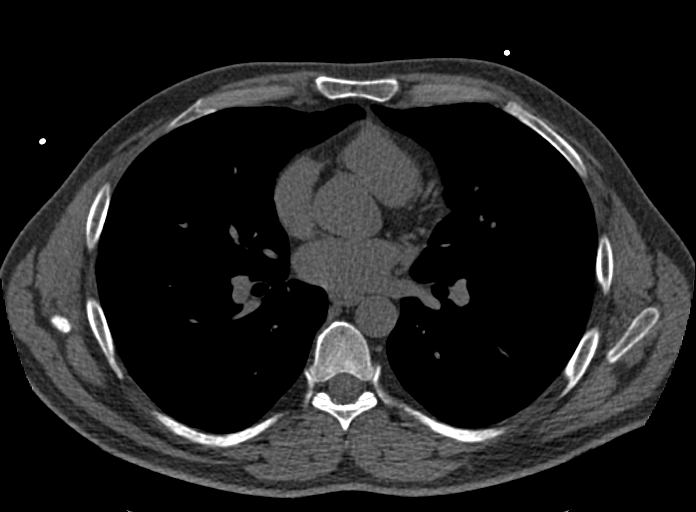
[im 66/80  vessel]
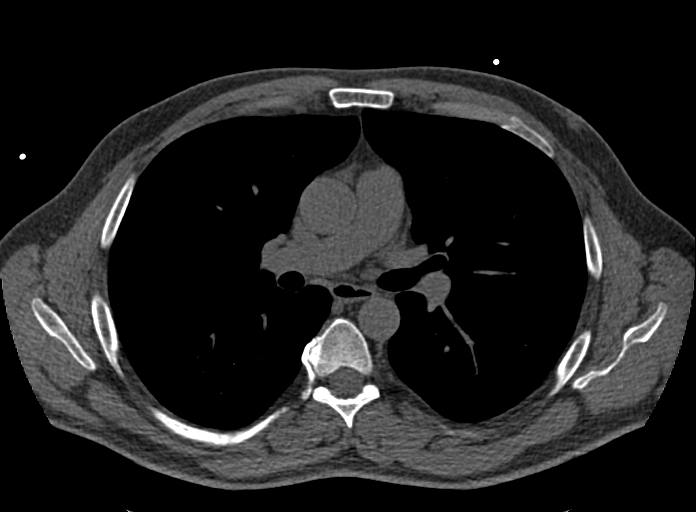
[im 66/80  lung]
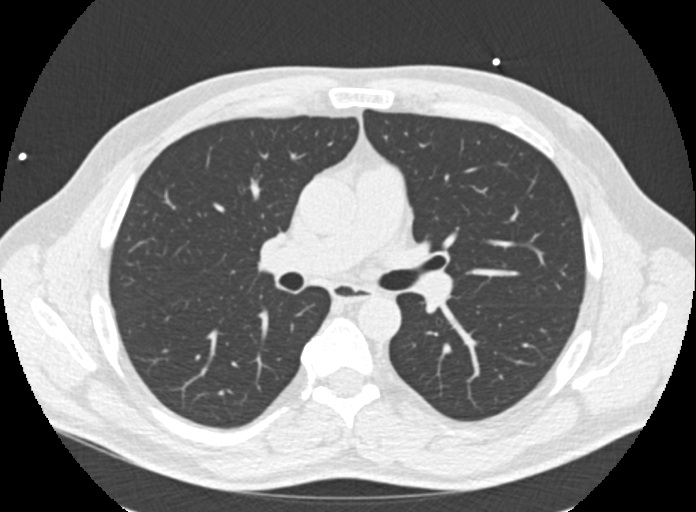

[Series 9: calcium scoring 2.00 br60 bestdiast 70% lungs · axial · 0.57mm/px · z∈[+1630,+1734]mm · 5 of 80 slices shown]
[im 14/80  vessel]
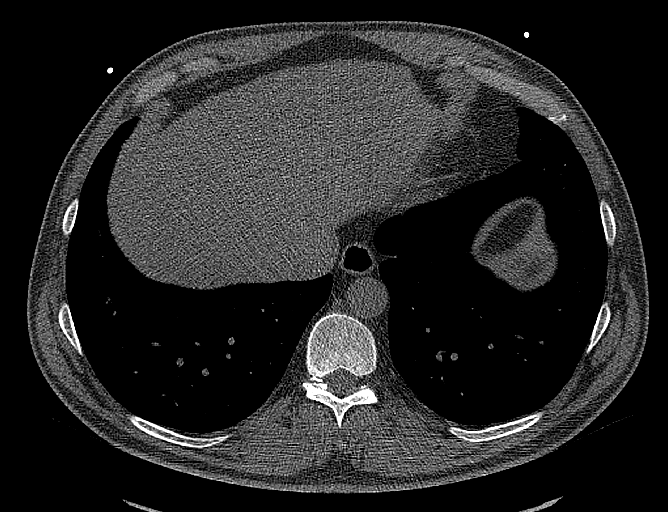
[im 27/80  vessel]
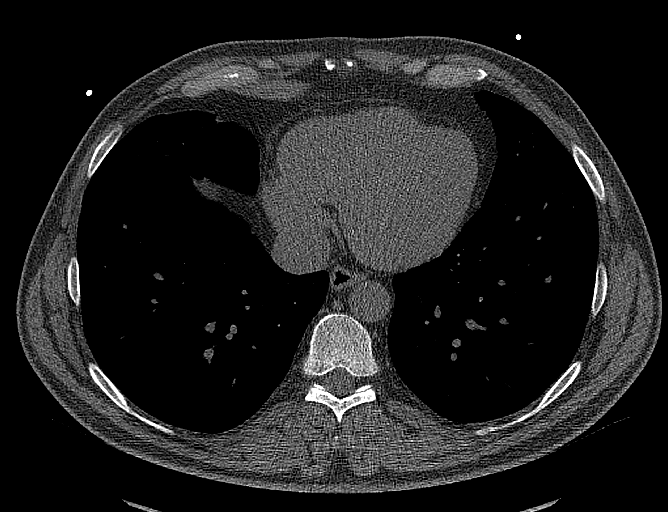
[im 40/80  vessel]
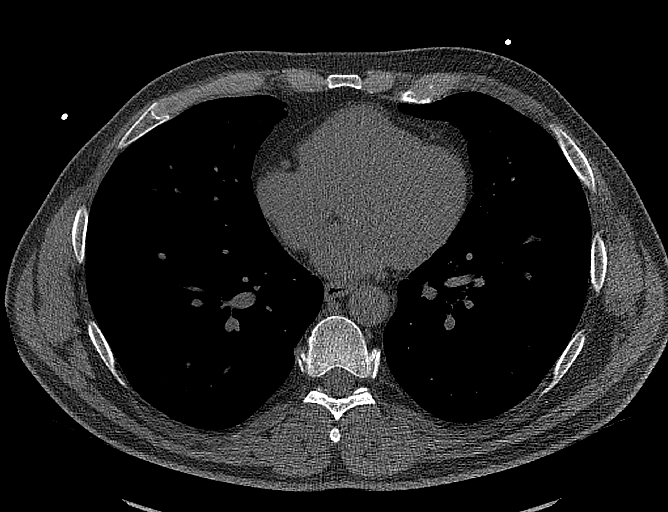
[im 53/80  vessel]
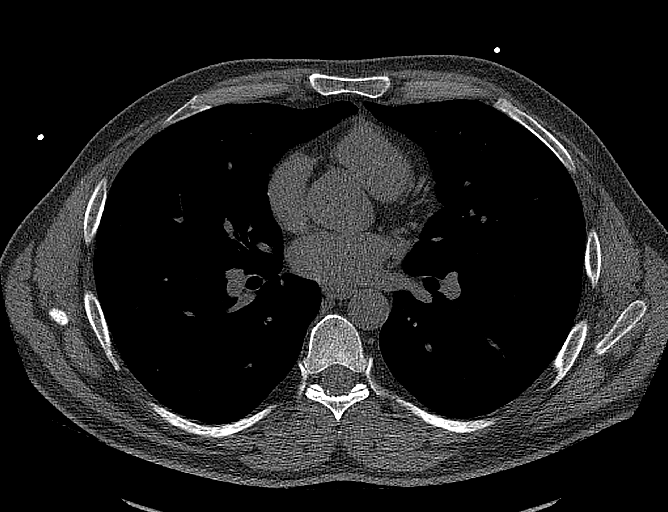
[im 66/80  vessel]
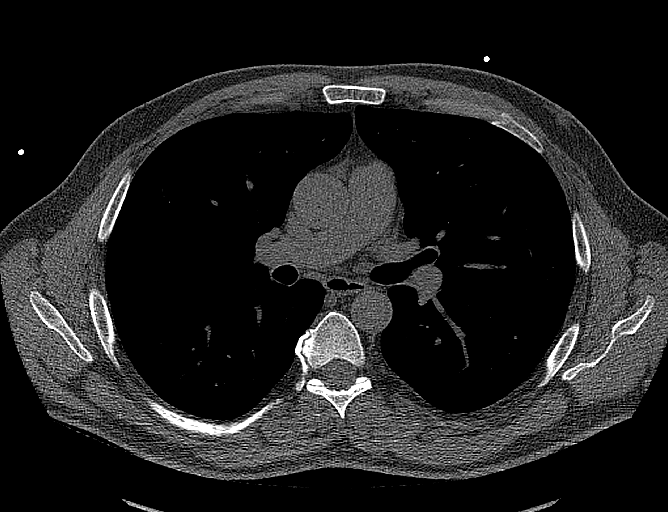

[13 of 20 positions shown; findings below may reference images not displayed]

FINDINGS: CORONARY CALCIUM SCORES:

Left Main: 0

LAD:

LCx: 0

RCA: 0

Total Agatston Score:

[HOSPITAL] percentile: 87

AORTA MEASUREMENTS:

Ascending Aorta: 32 mm

Descending Aorta: 22 mm

OTHER FINDINGS:

The heart size is within normal limits. No pericardial fluid is
identified. Visualized segments of the thoracic aorta and central
pulmonary arteries are normal in caliber. Visualized mediastinum and
hilar regions demonstrate no lymphadenopathy or masses. Stable tiny
1.6 mm subpleural nodule in the posterior aspect of the superior
segment of the right lower lobe on image [DATE]. This is stable since
0008 and therefore benign. Visualized lungs show no evidence of
pulmonary edema, consolidation, pneumothorax or pleural fluid.
Probable hepatic steatosis based on liver density. Visualized bony
structures are unremarkable.
IMPRESSION: 1. Coronary calcium score of 81.2 is at the 87th percentile for the
patient's age, sex and race.
2. Probable hepatic steatosis.
3. 1.6 mm subpleural right lower lobe nodule is stable since 0008
and therefore benign.

## 2022-04-04 IMAGING — CT CT ABD-PELV W/ CM
2 of 5 series · 16 of 46 positions shown, 18 images · IV contrast (iopamidol)
Comparison: 08/18/2019

CLINICAL DATA: Left lower quadrant pain for 1 month, history of
appendectomy

EXAM:
CT ABDOMEN AND PELVIS WITH CONTRAST
TECHNIQUE: Multidetector CT imaging of the abdomen and pelvis was performed
using the standard protocol following bolus administration of
intravenous contrast.
CONTRAST:  100mL [id number] IOPAMIDOL ([id number]) INJECTION 61%,
additional oral enteric contrast

[Series 2: abd pelvis 5.00 br40 s3 axial · axial · 0.79mm/px · z∈[+1208,+1628]mm · 13 of 96 slices shown, 15 images]
[im 6/96  soft-tissue]
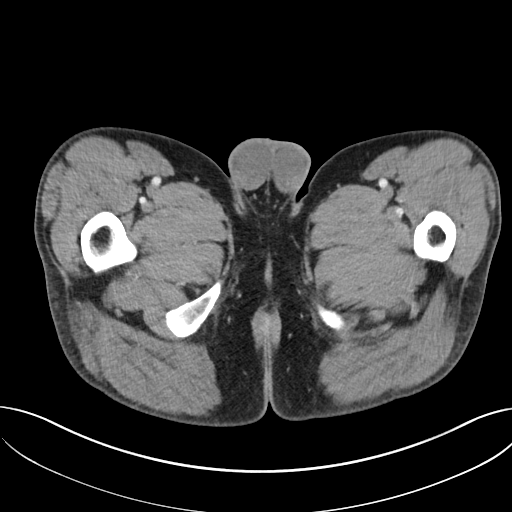
[im 6/96  bone]
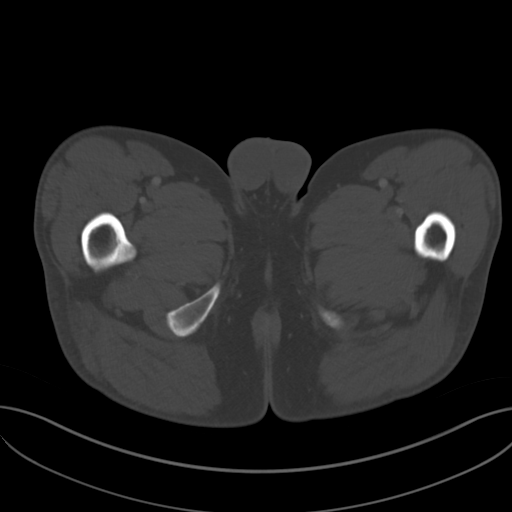
[im 12/96  soft-tissue]
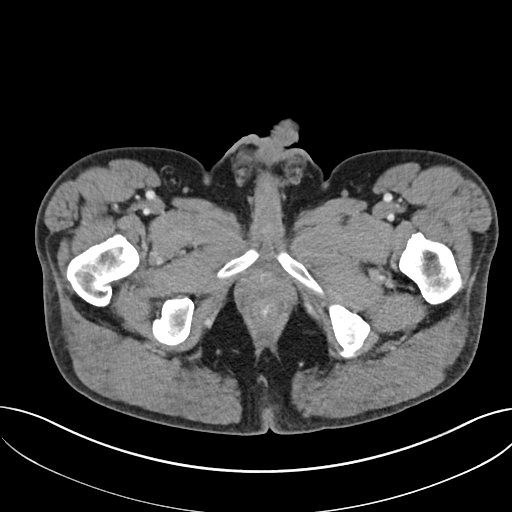
[im 23/96  soft-tissue]
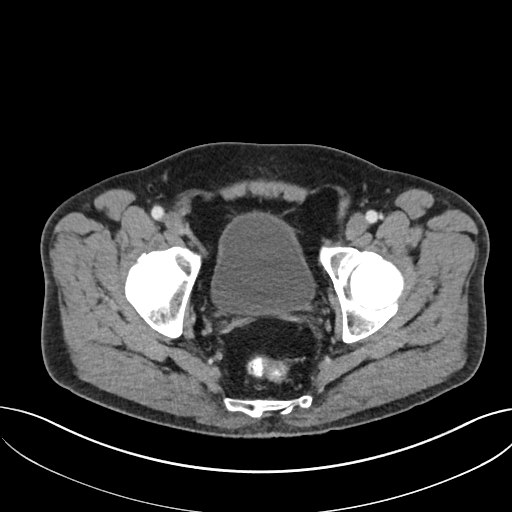
[im 28/96  soft-tissue]
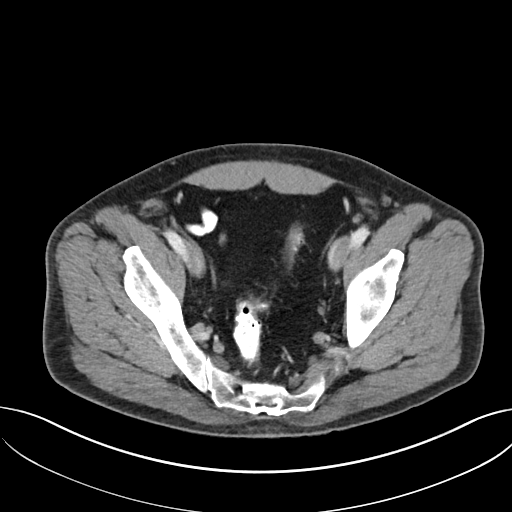
[im 34/96  soft-tissue]
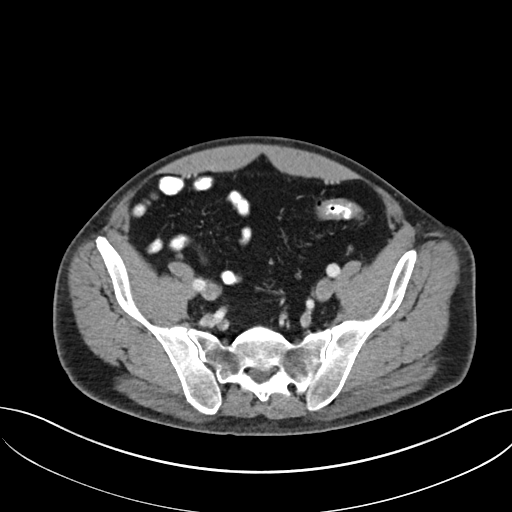
[im 40/96  soft-tissue]
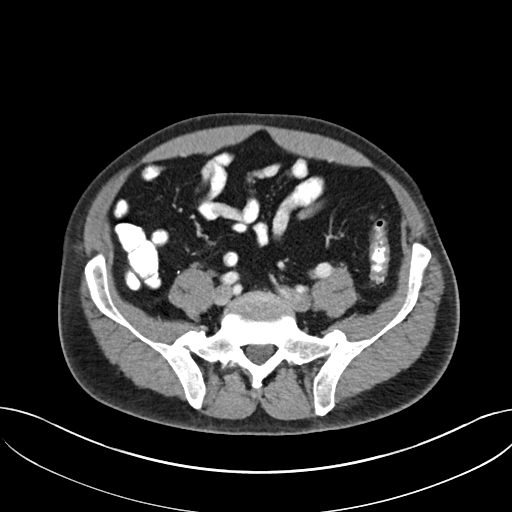
[im 51/96  soft-tissue]
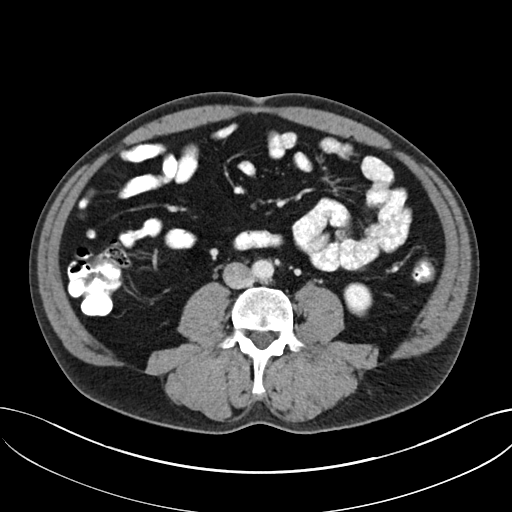
[im 56/96  soft-tissue]
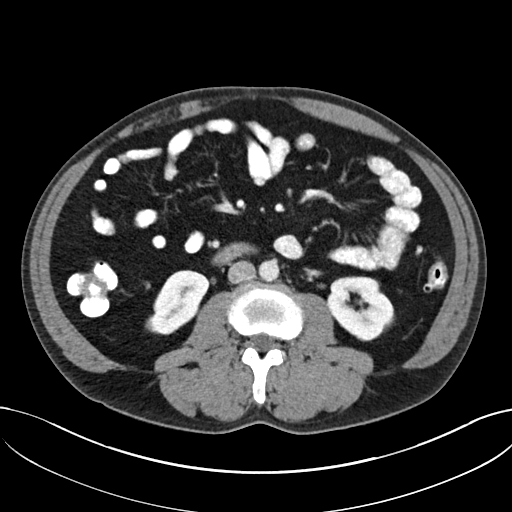
[im 62/96  soft-tissue]
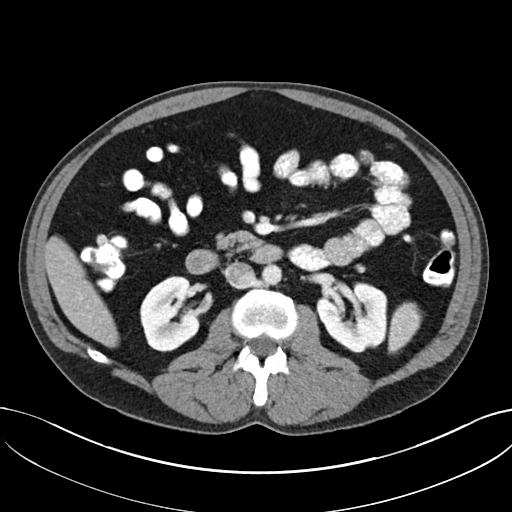
[im 62/96  bone]
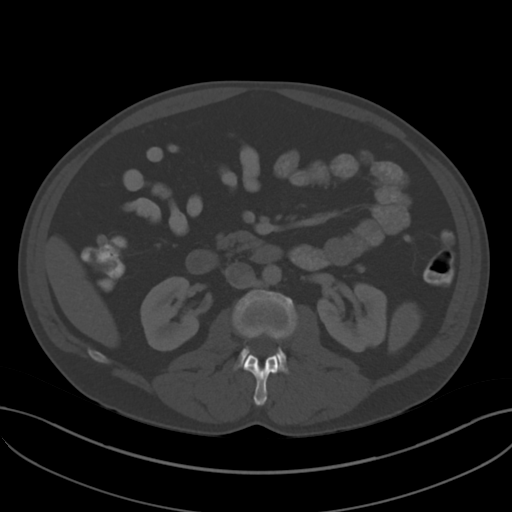
[im 68/96  soft-tissue]
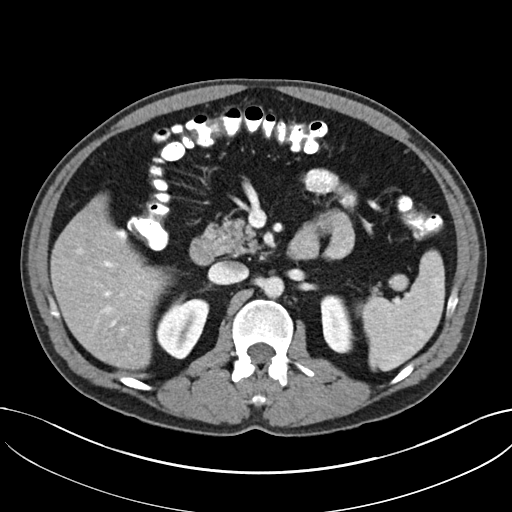
[im 73/96  soft-tissue]
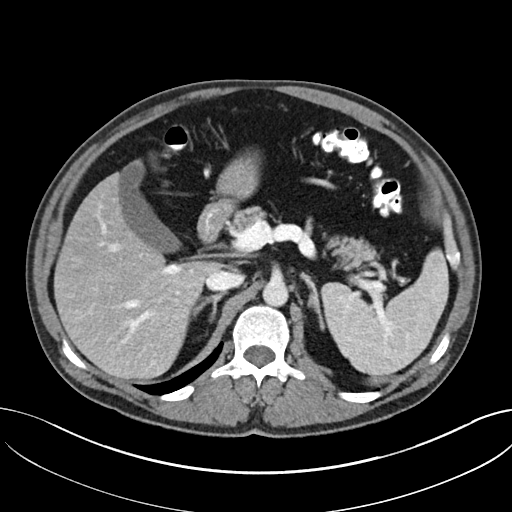
[im 84/96  soft-tissue]
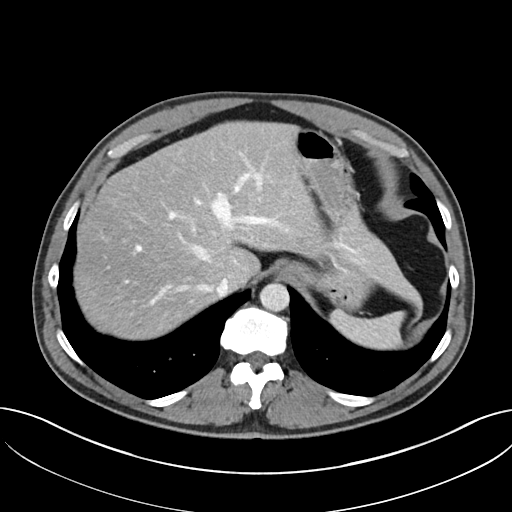
[im 90/96  soft-tissue]
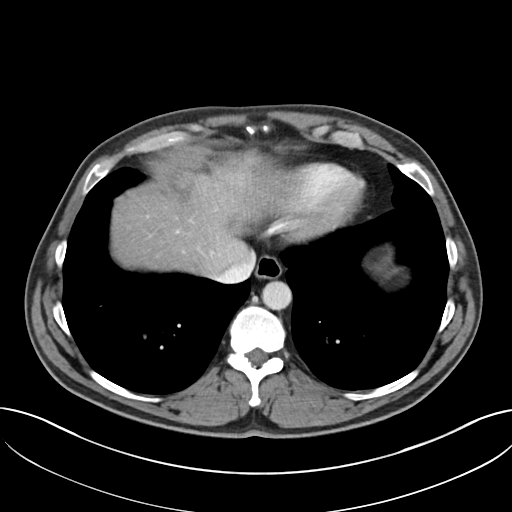

[Series 6: abd pelvis 2.00 br40 s3 cor · coronal · 0.79mm/px · 3 of 200 slices shown]
[im 67/200  soft-tissue]
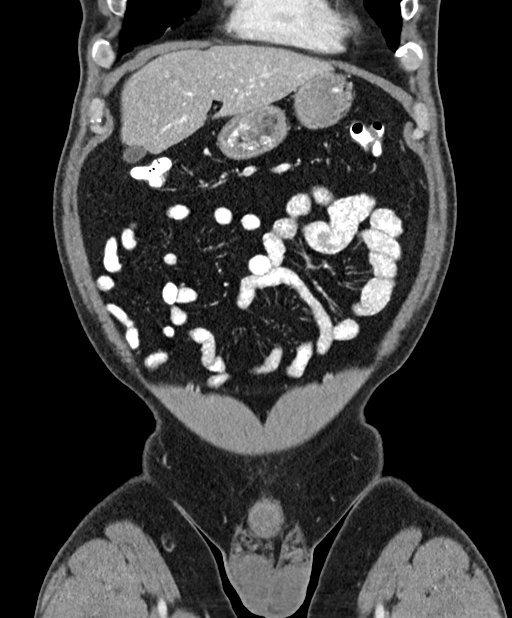
[im 89/200  soft-tissue]
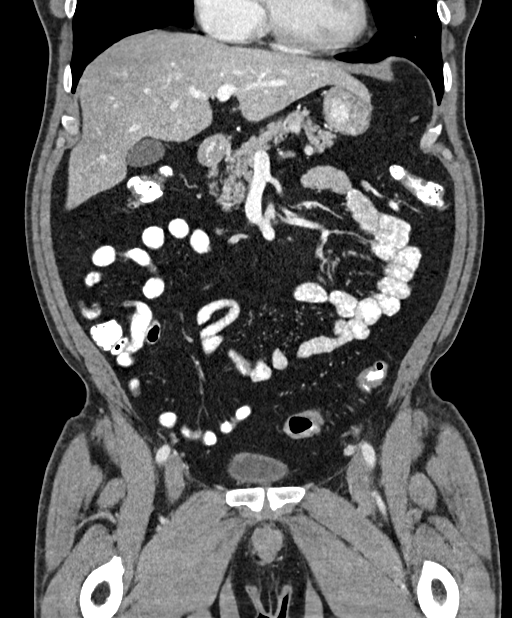
[im 111/200  soft-tissue]
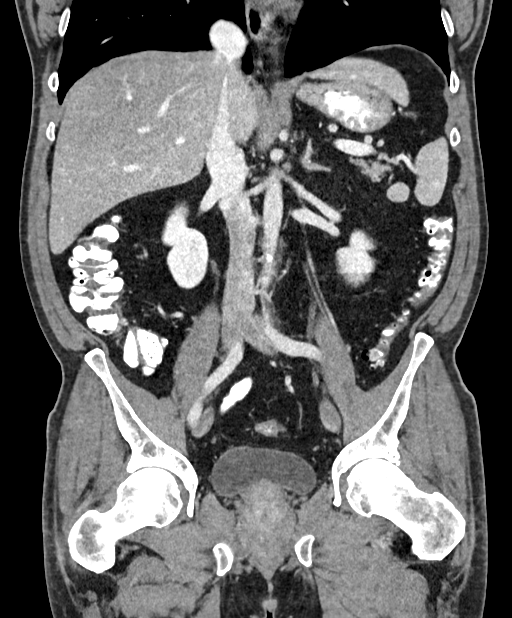

[16 of 46 positions shown; findings below may reference images not displayed]

FINDINGS: Lower chest: No acute abnormality.

Hepatobiliary: No solid liver abnormality is seen. No gallstones,
gallbladder wall thickening, or biliary dilatation.

Pancreas: Unremarkable. No pancreatic ductal dilatation or
surrounding inflammatory changes.

Spleen: Normal in size without significant abnormality.

Adrenals/Urinary Tract: Adrenal glands are unremarkable. Kidneys are
normal, without renal calculi, solid lesion, or hydronephrosis.
Bladder is unremarkable.

Stomach/Bowel: Stomach is within normal limits. Status post
appendectomy. Descending and sigmoid diverticulosis. No evidence of
bowel wall thickening, distention, or inflammatory changes.

Vascular/Lymphatic: No significant vascular findings are present. No
enlarged abdominal or pelvic lymph nodes.

Reproductive: No mass or other significant abnormality.

Other: No abdominal wall hernia or abnormality. No abdominopelvic
ascites.

Musculoskeletal: No acute or significant osseous findings.
IMPRESSION: 1. No CT findings of the abdomen or pelvis to explain left lower
quadrant pain.
2. Descending and sigmoid diverticulosis without evidence of acute
diverticulitis.
3. Status post appendectomy.

## 2022-10-21 ENCOUNTER — Other Ambulatory Visit: Payer: Self-pay | Admitting: Family Medicine

## 2022-10-21 DIAGNOSIS — R7989 Other specified abnormal findings of blood chemistry: Secondary | ICD-10-CM

## 2022-11-07 ENCOUNTER — Other Ambulatory Visit: Payer: BC Managed Care – PPO

## 2022-11-13 ENCOUNTER — Ambulatory Visit
Admission: RE | Admit: 2022-11-13 | Discharge: 2022-11-13 | Disposition: A | Discharge status: Home / Self Care | Payer: BC Managed Care – PPO | Source: Ambulatory Visit | Attending: Family Medicine | Admitting: Family Medicine

## 2022-11-13 DIAGNOSIS — R7989 Other specified abnormal findings of blood chemistry: Secondary | ICD-10-CM

## 2022-11-13 DIAGNOSIS — R945 Abnormal results of liver function studies: Secondary | ICD-10-CM | POA: Diagnosis not present

## 2023-01-05 NOTE — Progress Notes (Signed)
error 

## 2023-10-26 ENCOUNTER — Other Ambulatory Visit: Payer: Self-pay | Admitting: Family Medicine

## 2023-10-26 DIAGNOSIS — I7090 Unspecified atherosclerosis: Secondary | ICD-10-CM

## 2023-10-26 DIAGNOSIS — R109 Unspecified abdominal pain: Secondary | ICD-10-CM

## 2023-11-04 ENCOUNTER — Ambulatory Visit
Admission: RE | Admit: 2023-11-04 | Discharge: 2023-11-04 | Disposition: A | Discharge status: Home / Self Care | Payer: BC Managed Care – PPO | Source: Ambulatory Visit | Attending: Family Medicine | Admitting: Family Medicine

## 2023-11-04 ENCOUNTER — Other Ambulatory Visit: Payer: Self-pay | Admitting: Family Medicine

## 2023-11-04 DIAGNOSIS — R109 Unspecified abdominal pain: Secondary | ICD-10-CM

## 2023-11-04 DIAGNOSIS — I7090 Unspecified atherosclerosis: Secondary | ICD-10-CM

## 2023-11-04 MED ORDER — IOPAMIDOL (ISOVUE-300) INJECTION 61%
100.0000 mL | Freq: Once | INTRAVENOUS | Status: AC | PRN
  Administered 2023-11-04: 100 mL via INTRAVENOUS

## 2024-06-20 ENCOUNTER — Other Ambulatory Visit: Payer: Self-pay | Admitting: Family Medicine

## 2024-06-20 DIAGNOSIS — R109 Unspecified abdominal pain: Secondary | ICD-10-CM

## 2024-06-20 DIAGNOSIS — Z8719 Personal history of other diseases of the digestive system: Secondary | ICD-10-CM

## 2024-06-21 ENCOUNTER — Encounter: Payer: Self-pay | Admitting: Family Medicine

## 2024-06-22 ENCOUNTER — Encounter: Payer: Self-pay | Admitting: Radiology

## 2024-06-22 ENCOUNTER — Ambulatory Visit
Admission: RE | Admit: 2024-06-22 | Discharge: 2024-06-22 | Disposition: A | Discharge status: Home / Self Care | Source: Ambulatory Visit | Attending: Family Medicine | Admitting: Family Medicine

## 2024-06-22 DIAGNOSIS — R109 Unspecified abdominal pain: Secondary | ICD-10-CM

## 2024-06-22 DIAGNOSIS — Z8719 Personal history of other diseases of the digestive system: Secondary | ICD-10-CM

## 2024-06-22 MED ORDER — IOPAMIDOL (ISOVUE-300) INJECTION 61%
100.0000 mL | Freq: Once | INTRAVENOUS | Status: AC | PRN
  Administered 2024-06-22: 100 mL via INTRAVENOUS
